# Patient Record
Sex: Female | Born: 1995
Health system: Southern US, Community
[De-identification: ages and names within clinical notes are randomized; demographics above are authoritative.]

## PROBLEM LIST (undated history)

## (undated) DIAGNOSIS — L309 Dermatitis, unspecified: Secondary | ICD-10-CM

## (undated) DIAGNOSIS — L509 Urticaria, unspecified: Secondary | ICD-10-CM

## (undated) DIAGNOSIS — J45909 Unspecified asthma, uncomplicated: Secondary | ICD-10-CM

## (undated) HISTORY — DX: Urticaria, unspecified: L50.9

## (undated) HISTORY — DX: Dermatitis, unspecified: L30.9

## (undated) HISTORY — PX: TYMPANOSTOMY TUBE PLACEMENT: SHX32

## (undated) HISTORY — DX: Unspecified asthma, uncomplicated: J45.909

---
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
## 2017-01-09 DIAGNOSIS — Z3042 Encounter for surveillance of injectable contraceptive: Secondary | ICD-10-CM | POA: Diagnosis not present

## 2017-02-03 DIAGNOSIS — Z202 Contact with and (suspected) exposure to infections with a predominantly sexual mode of transmission: Secondary | ICD-10-CM | POA: Diagnosis not present

## 2017-02-03 DIAGNOSIS — L259 Unspecified contact dermatitis, unspecified cause: Secondary | ICD-10-CM | POA: Diagnosis not present

## 2017-02-15 DIAGNOSIS — B373 Candidiasis of vulva and vagina: Secondary | ICD-10-CM | POA: Diagnosis not present

## 2017-02-15 DIAGNOSIS — N898 Other specified noninflammatory disorders of vagina: Secondary | ICD-10-CM | POA: Diagnosis not present

## 2017-03-02 DIAGNOSIS — N760 Acute vaginitis: Secondary | ICD-10-CM | POA: Diagnosis not present

## 2017-03-02 DIAGNOSIS — N899 Noninflammatory disorder of vagina, unspecified: Secondary | ICD-10-CM | POA: Diagnosis not present

## 2017-03-16 DIAGNOSIS — S9002XA Contusion of left ankle, initial encounter: Secondary | ICD-10-CM | POA: Diagnosis not present

## 2017-03-16 DIAGNOSIS — S8002XA Contusion of left knee, initial encounter: Secondary | ICD-10-CM | POA: Diagnosis not present

## 2017-03-30 DIAGNOSIS — Z113 Encounter for screening for infections with a predominantly sexual mode of transmission: Secondary | ICD-10-CM | POA: Diagnosis not present

## 2017-04-03 DIAGNOSIS — Z3042 Encounter for surveillance of injectable contraceptive: Secondary | ICD-10-CM | POA: Diagnosis not present

## 2017-04-18 DIAGNOSIS — K080 Exfoliation of teeth due to systemic causes: Secondary | ICD-10-CM | POA: Diagnosis not present

## 2017-05-02 DIAGNOSIS — K080 Exfoliation of teeth due to systemic causes: Secondary | ICD-10-CM | POA: Diagnosis not present

## 2017-05-10 DIAGNOSIS — H6983 Other specified disorders of Eustachian tube, bilateral: Secondary | ICD-10-CM | POA: Diagnosis not present

## 2017-05-10 DIAGNOSIS — J069 Acute upper respiratory infection, unspecified: Secondary | ICD-10-CM | POA: Diagnosis not present

## 2017-05-10 DIAGNOSIS — R509 Fever, unspecified: Secondary | ICD-10-CM | POA: Diagnosis not present

## 2017-05-10 DIAGNOSIS — H65192 Other acute nonsuppurative otitis media, left ear: Secondary | ICD-10-CM | POA: Diagnosis not present

## 2017-05-30 DIAGNOSIS — L700 Acne vulgaris: Secondary | ICD-10-CM | POA: Diagnosis not present

## 2017-05-31 DIAGNOSIS — Z23 Encounter for immunization: Secondary | ICD-10-CM | POA: Diagnosis not present

## 2017-06-12 DIAGNOSIS — N760 Acute vaginitis: Secondary | ICD-10-CM | POA: Diagnosis not present

## 2017-06-19 DIAGNOSIS — Z3042 Encounter for surveillance of injectable contraceptive: Secondary | ICD-10-CM | POA: Diagnosis not present

## 2017-07-14 DIAGNOSIS — N899 Noninflammatory disorder of vagina, unspecified: Secondary | ICD-10-CM | POA: Diagnosis not present

## 2017-07-14 DIAGNOSIS — N938 Other specified abnormal uterine and vaginal bleeding: Secondary | ICD-10-CM | POA: Diagnosis not present

## 2017-07-14 DIAGNOSIS — R109 Unspecified abdominal pain: Secondary | ICD-10-CM | POA: Diagnosis not present

## 2017-07-26 DIAGNOSIS — K080 Exfoliation of teeth due to systemic causes: Secondary | ICD-10-CM | POA: Diagnosis not present

## 2017-08-08 DIAGNOSIS — J029 Acute pharyngitis, unspecified: Secondary | ICD-10-CM | POA: Diagnosis not present

## 2017-08-24 DIAGNOSIS — Z01419 Encounter for gynecological examination (general) (routine) without abnormal findings: Secondary | ICD-10-CM | POA: Diagnosis not present

## 2017-09-05 DIAGNOSIS — Z30017 Encounter for initial prescription of implantable subdermal contraceptive: Secondary | ICD-10-CM | POA: Diagnosis not present

## 2017-09-05 DIAGNOSIS — Z3202 Encounter for pregnancy test, result negative: Secondary | ICD-10-CM | POA: Diagnosis not present

## 2017-09-11 ENCOUNTER — Encounter (INDEPENDENT_AMBULATORY_CARE_PROVIDER_SITE_OTHER): Payer: Self-pay | Admitting: Pediatric Gastroenterology

## 2017-10-26 DIAGNOSIS — Z309 Encounter for contraceptive management, unspecified: Secondary | ICD-10-CM | POA: Diagnosis not present

## 2017-10-26 DIAGNOSIS — N760 Acute vaginitis: Secondary | ICD-10-CM | POA: Diagnosis not present

## 2017-10-26 DIAGNOSIS — Z01419 Encounter for gynecological examination (general) (routine) without abnormal findings: Secondary | ICD-10-CM | POA: Diagnosis not present

## 2017-11-05 IMAGING — CR DG ABDOMEN 1V
1 series · 1 of 1 positions shown · non-contrast
Comparison: None.

CLINICAL DATA: Rectal, constipation

EXAM:
ABDOMEN - 1 VIEW

[t abdomen supine]
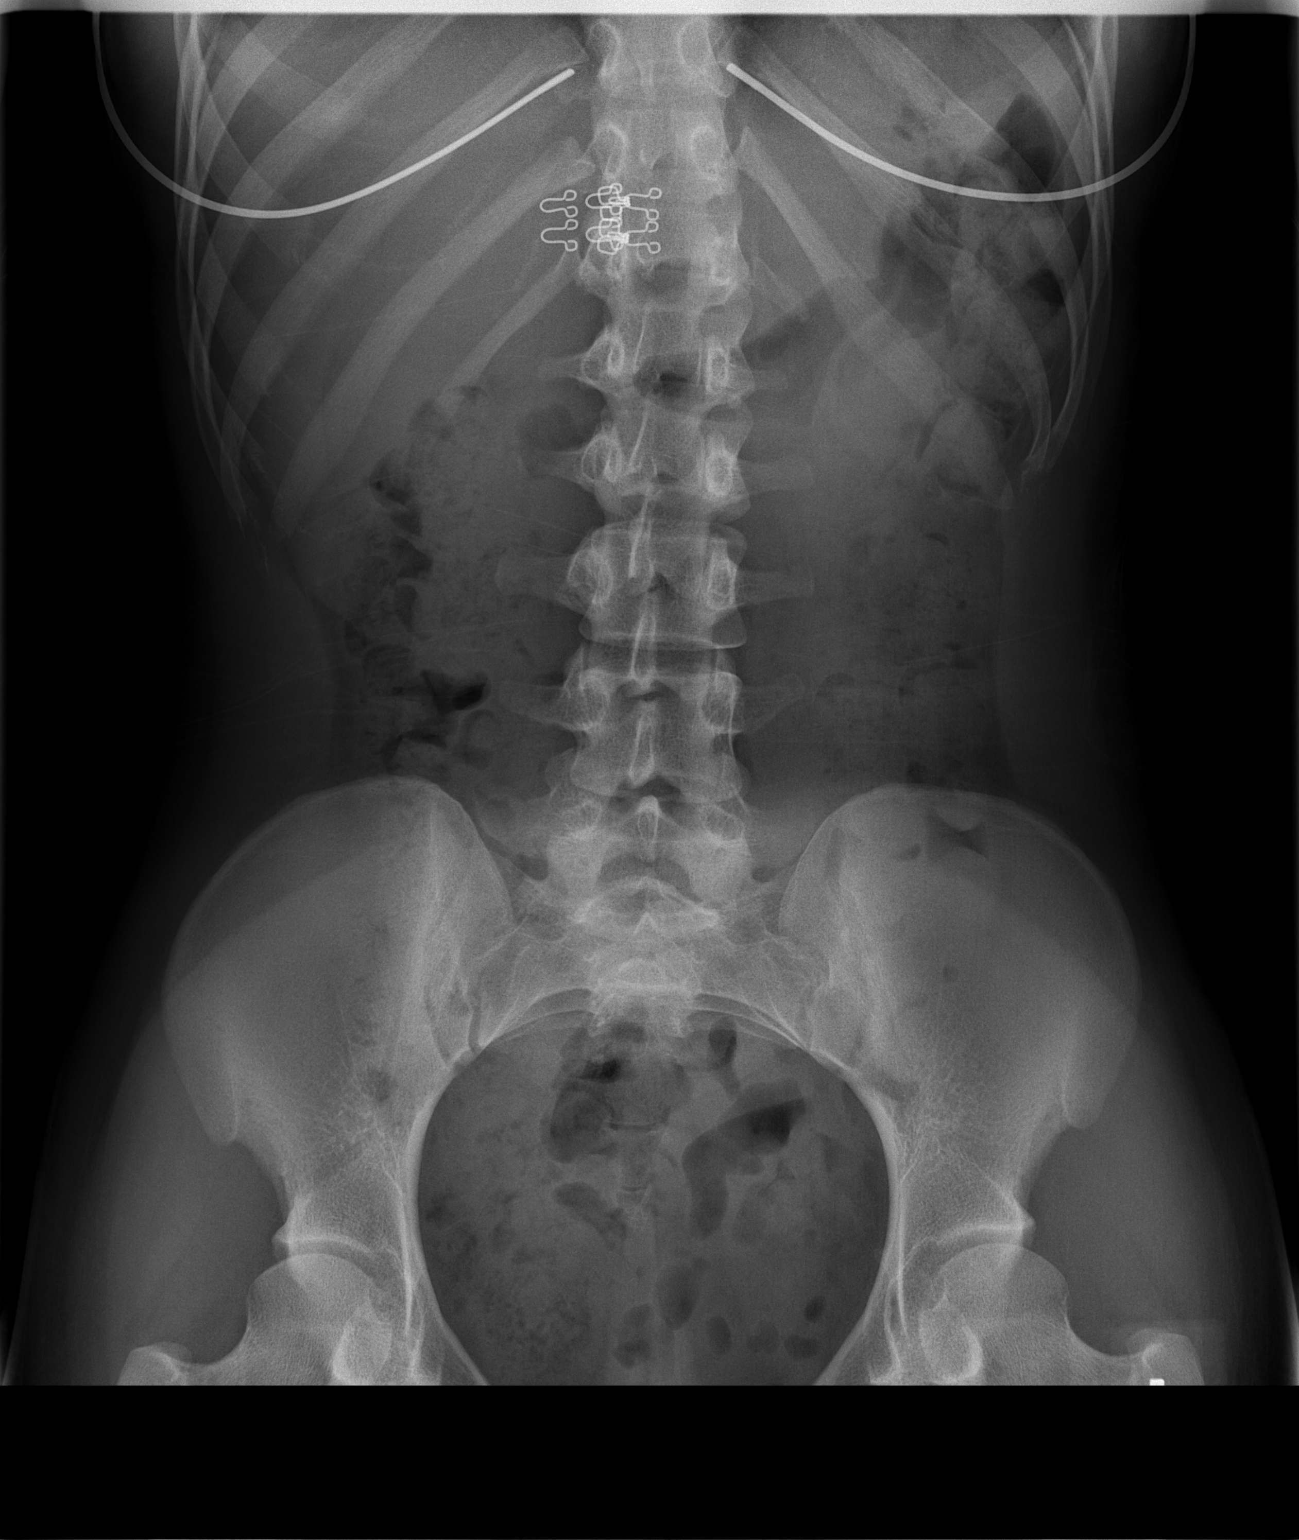

[1 of 1 positions shown; findings below may reference images not displayed]

FINDINGS: Moderate amount of stool throughout the colon. There is no bowel
dilatation to suggest obstruction. There is no evidence of
pneumoperitoneum, portal venous gas or pneumatosis. There are no
pathologic calcifications along the expected course of the ureters.
The osseous structures are unremarkable.
IMPRESSION: Moderate amount of stool throughout the colon.

## 2017-11-06 DIAGNOSIS — K080 Exfoliation of teeth due to systemic causes: Secondary | ICD-10-CM | POA: Diagnosis not present

## 2017-11-23 DIAGNOSIS — Z113 Encounter for screening for infections with a predominantly sexual mode of transmission: Secondary | ICD-10-CM | POA: Diagnosis not present

## 2017-11-29 DIAGNOSIS — R51 Headache: Secondary | ICD-10-CM | POA: Diagnosis not present

## 2017-12-14 DIAGNOSIS — Z30017 Encounter for initial prescription of implantable subdermal contraceptive: Secondary | ICD-10-CM | POA: Diagnosis not present

## 2017-12-14 DIAGNOSIS — Z3202 Encounter for pregnancy test, result negative: Secondary | ICD-10-CM | POA: Diagnosis not present

## 2018-01-15 DIAGNOSIS — Z113 Encounter for screening for infections with a predominantly sexual mode of transmission: Secondary | ICD-10-CM | POA: Diagnosis not present

## 2018-02-01 DIAGNOSIS — Z113 Encounter for screening for infections with a predominantly sexual mode of transmission: Secondary | ICD-10-CM | POA: Diagnosis not present

## 2018-02-01 DIAGNOSIS — N898 Other specified noninflammatory disorders of vagina: Secondary | ICD-10-CM | POA: Diagnosis not present

## 2018-03-02 DIAGNOSIS — N760 Acute vaginitis: Secondary | ICD-10-CM | POA: Diagnosis not present

## 2018-03-02 DIAGNOSIS — Z113 Encounter for screening for infections with a predominantly sexual mode of transmission: Secondary | ICD-10-CM | POA: Diagnosis not present

## 2018-03-02 DIAGNOSIS — R350 Frequency of micturition: Secondary | ICD-10-CM | POA: Diagnosis not present

## 2018-04-03 DIAGNOSIS — N926 Irregular menstruation, unspecified: Secondary | ICD-10-CM | POA: Diagnosis not present

## 2018-04-10 DIAGNOSIS — Z978 Presence of other specified devices: Secondary | ICD-10-CM | POA: Diagnosis not present

## 2018-04-10 DIAGNOSIS — N921 Excessive and frequent menstruation with irregular cycle: Secondary | ICD-10-CM | POA: Diagnosis not present

## 2018-04-17 DIAGNOSIS — A749 Chlamydial infection, unspecified: Secondary | ICD-10-CM | POA: Diagnosis not present

## 2018-04-17 DIAGNOSIS — Z113 Encounter for screening for infections with a predominantly sexual mode of transmission: Secondary | ICD-10-CM | POA: Diagnosis not present

## 2018-04-18 DIAGNOSIS — Z113 Encounter for screening for infections with a predominantly sexual mode of transmission: Secondary | ICD-10-CM | POA: Diagnosis not present

## 2018-04-18 DIAGNOSIS — A749 Chlamydial infection, unspecified: Secondary | ICD-10-CM | POA: Diagnosis not present

## 2018-05-09 DIAGNOSIS — Z3042 Encounter for surveillance of injectable contraceptive: Secondary | ICD-10-CM | POA: Diagnosis not present

## 2018-05-09 DIAGNOSIS — Z3046 Encounter for surveillance of implantable subdermal contraceptive: Secondary | ICD-10-CM | POA: Diagnosis not present

## 2018-05-14 DIAGNOSIS — K080 Exfoliation of teeth due to systemic causes: Secondary | ICD-10-CM | POA: Diagnosis not present

## 2018-05-25 DIAGNOSIS — N926 Irregular menstruation, unspecified: Secondary | ICD-10-CM | POA: Diagnosis not present

## 2018-07-31 DIAGNOSIS — Z3042 Encounter for surveillance of injectable contraceptive: Secondary | ICD-10-CM | POA: Diagnosis not present

## 2018-08-08 DIAGNOSIS — L700 Acne vulgaris: Secondary | ICD-10-CM | POA: Diagnosis not present

## 2018-08-20 ENCOUNTER — Encounter (HOSPITAL_COMMUNITY): Payer: Self-pay | Admitting: Emergency Medicine

## 2018-08-20 ENCOUNTER — Other Ambulatory Visit: Payer: Self-pay

## 2018-08-20 ENCOUNTER — Ambulatory Visit (HOSPITAL_COMMUNITY)
Admission: EM | Admit: 2018-08-20 | Discharge: 2018-08-20 | Disposition: A | Discharge status: Home / Self Care | Payer: Federal, State, Local not specified - PPO | Attending: Family Medicine | Admitting: Family Medicine

## 2018-08-20 DIAGNOSIS — R42 Dizziness and giddiness: Secondary | ICD-10-CM

## 2018-08-20 DIAGNOSIS — R55 Syncope and collapse: Secondary | ICD-10-CM | POA: Diagnosis not present

## 2018-08-20 DIAGNOSIS — Z3202 Encounter for pregnancy test, result negative: Secondary | ICD-10-CM

## 2018-08-20 DIAGNOSIS — R110 Nausea: Secondary | ICD-10-CM | POA: Diagnosis not present

## 2018-08-20 LAB — POCT URINALYSIS DIP (DEVICE)
Bilirubin Urine: NEGATIVE
Glucose, UA: NEGATIVE mg/dL
Ketones, ur: NEGATIVE mg/dL
Leukocytes, UA: NEGATIVE
Nitrite: NEGATIVE
Protein, ur: 30 mg/dL — AB
Urobilinogen, UA: 0.2 mg/dL (ref 0.0–1.0)
pH: 5.5 (ref 5.0–8.0)

## 2018-08-20 LAB — GLUCOSE, CAPILLARY: Glucose-Capillary: 93 mg/dL (ref 70–99)

## 2018-08-20 LAB — POCT PREGNANCY, URINE: Preg Test, Ur: NEGATIVE

## 2018-08-20 NOTE — Discharge Instructions (Addendum)
Drink plenty of fluids today and tomorrow.  Your laboratory tests are normal.

## 2018-08-20 NOTE — ED Triage Notes (Signed)
Pt was at a club last night and was smoking marijuana from someone she barely knew.  Pt states she got dizzy, and passed out.  When she woke up she was nauseous, sweating and still dizzy.  Today, pt is still feeling dizzy and having bouts of nausea.

## 2018-08-20 NOTE — ED Provider Notes (Signed)
[facility]    CSN: [id number] Arrival date & time: 08/20/18  1151     History   Chief Complaint Chief Complaint  Patient presents with  . Near Syncope    HPI Glenda Adams is a 23 y.o. female.   Pt was at a club last night and was smoking marijuana from someone she barely knew.  Pt states she got dizzy, and passed out.  When she woke up she was nauseous, sweating and still dizzy.  Today, pt is still feeling dizzy and having bouts of nausea.  She denies drinking anything at the bar last night.  Patient is an established patient.  She goes to [employer] [facility] [employer].     History reviewed. No pertinent past medical history.  Patient Active Problem List   Diagnosis Date Noted  . Anal fissure 06/01/2016  . Rectal bleeding 06/01/2016    History reviewed. No pertinent surgical history.  OB History    Gravida  1   Para      Term      Preterm      AB      Living        SAB      TAB      Ectopic      Multiple      Live Births               Home Medications    Prior to Admission medications   Medication Sig Start Date End Date Taking? Authorizing Provider  etonogestrel (NEXPLANON) 68 MG IMPL implant 1 each by Subdermal route once.   Yes [provider]    Family History History reviewed. No pertinent family history.  Social History Social History   Tobacco Use  . Smoking status: Never Smoker  . Smokeless tobacco: Never Used  Substance Use Topics  . Alcohol use: Yes  . Drug use: Yes    Types: Marijuana     Allergies   Patient has no known allergies.   Review of Systems Review of Systems   Physical Exam Triage Vital Signs ED Triage Vitals [08/20/18 1224]  Enc Vitals Group     BP 112/62     Pulse Rate 88     Resp      Temp 98.2 F (36.8 C)     Temp Source Temporal     SpO2 100 %     Weight      Height      Head Circumference      Peak Flow      Pain Score      Pain Loc        Pain Edu?      Excl. in GC?    No data found.  Updated Vital Signs BP 112/62 (BP Location: Left Arm)   Pulse 88   Temp 98.2 F (36.8 C) (Temporal)   LMP 08/13/2018 (Exact Date)   SpO2 100%    Physical Exam Vitals signs and nursing note reviewed.  Constitutional:      Appearance: Normal appearance. She is normal weight.  HENT:     Head: Normocephalic and atraumatic.     Right Ear: Tympanic membrane, ear canal and external ear normal.     Left Ear: Tympanic membrane, ear canal and external ear normal.     Nose: Nose normal.     Mouth/Throat:     Mouth: Mucous membranes are moist.     Pharynx: Oropharynx is clear.  Eyes:     Conjunctiva/sclera: Conjunctivae normal.     Pupils: Pupils are equal, round, and reactive to light.  Neck:     Musculoskeletal: Normal range of motion and neck supple.  Cardiovascular:     Rate and Rhythm: Normal rate and regular rhythm.     Heart sounds: Normal heart sounds.  Pulmonary:     Effort: Pulmonary effort is normal.     Breath sounds: Normal breath sounds.  Abdominal:     General: Abdomen is flat. Bowel sounds are normal.     Palpations: There is no mass.     Tenderness: There is no abdominal tenderness.  Musculoskeletal: Normal range of motion.  Skin:    General: Skin is warm and dry.  Neurological:     General: No focal deficit present.     Mental Status: She is alert and oriented to person, place, and time.  Psychiatric:        Mood and Affect: Mood normal.        Behavior: Behavior normal.        Thought Content: Thought content normal.      UC Treatments / Results  Labs (all labs ordered are listed, but only abnormal results are displayed) Labs Reviewed  POCT URINALYSIS DIP (DEVICE) - Abnormal; Notable for the following components:      Result Value   Hgb urine dipstick LARGE (*)    Protein, ur 30 (*)    All other components within normal limits  GLUCOSE, CAPILLARY  CBG MONITORING, ED  POC URINE PREG, ED  POCT  PREGNANCY, URINE    EKG None  Radiology No results found.  Procedures Procedures (including critical care time)  Medications Ordered in UC Medications - No data to display  Initial Impression / Assessment and Plan / UC Course  I have reviewed the triage vital signs and the nursing notes.  Pertinent labs & imaging results that were available during my care of the patient were reviewed by me and considered in my medical decision making (see chart for details).  Clinical Course as of Aug 20 1345  Mon Aug 20, 2018  1316 POC CBG monitoring [KL]  1316 POC urine pregnancy [KL]    Clinical Course User Index [KL] Elvina Sidle, MD   Final Clinical Impressions(s) / UC Diagnoses   Final diagnoses:  Near syncope     Discharge Instructions     Drink plenty of fluids today and tomorrow.  Your laboratory tests are normal.    ED Prescriptions    None     Controlled Substance Prescriptions [place] Controlled Substance Registry consulted? Not Applicable   Elvina Sidle, MD 08/20/18 1347

## 2018-08-21 DIAGNOSIS — Z113 Encounter for screening for infections with a predominantly sexual mode of transmission: Secondary | ICD-10-CM | POA: Diagnosis not present

## 2018-08-21 DIAGNOSIS — Z3046 Encounter for surveillance of implantable subdermal contraceptive: Secondary | ICD-10-CM | POA: Diagnosis not present

## 2018-08-21 DIAGNOSIS — Z30015 Encounter for initial prescription of vaginal ring hormonal contraceptive: Secondary | ICD-10-CM | POA: Diagnosis not present

## 2018-08-21 DIAGNOSIS — R55 Syncope and collapse: Secondary | ICD-10-CM | POA: Diagnosis not present

## 2018-09-13 DIAGNOSIS — B373 Candidiasis of vulva and vagina: Secondary | ICD-10-CM | POA: Diagnosis not present

## 2018-09-13 DIAGNOSIS — N760 Acute vaginitis: Secondary | ICD-10-CM | POA: Diagnosis not present

## 2018-09-13 DIAGNOSIS — Z113 Encounter for screening for infections with a predominantly sexual mode of transmission: Secondary | ICD-10-CM | POA: Diagnosis not present

## 2018-10-04 DIAGNOSIS — K080 Exfoliation of teeth due to systemic causes: Secondary | ICD-10-CM | POA: Diagnosis not present

## 2018-10-16 DIAGNOSIS — N898 Other specified noninflammatory disorders of vagina: Secondary | ICD-10-CM | POA: Diagnosis not present

## 2018-10-18 DIAGNOSIS — Z3042 Encounter for surveillance of injectable contraceptive: Secondary | ICD-10-CM | POA: Diagnosis not present

## 2018-11-22 DIAGNOSIS — N760 Acute vaginitis: Secondary | ICD-10-CM | POA: Diagnosis not present

## 2018-11-22 DIAGNOSIS — Z113 Encounter for screening for infections with a predominantly sexual mode of transmission: Secondary | ICD-10-CM | POA: Diagnosis not present

## 2018-11-22 DIAGNOSIS — B373 Candidiasis of vulva and vagina: Secondary | ICD-10-CM | POA: Diagnosis not present

## 2019-01-03 DIAGNOSIS — Z6820 Body mass index (BMI) 20.0-20.9, adult: Secondary | ICD-10-CM | POA: Diagnosis not present

## 2019-01-03 DIAGNOSIS — Z3042 Encounter for surveillance of injectable contraceptive: Secondary | ICD-10-CM | POA: Diagnosis not present

## 2019-01-03 DIAGNOSIS — Z01419 Encounter for gynecological examination (general) (routine) without abnormal findings: Secondary | ICD-10-CM | POA: Diagnosis not present

## 2019-01-03 DIAGNOSIS — Z113 Encounter for screening for infections with a predominantly sexual mode of transmission: Secondary | ICD-10-CM | POA: Diagnosis not present

## 2019-01-07 DIAGNOSIS — Z20828 Contact with and (suspected) exposure to other viral communicable diseases: Secondary | ICD-10-CM | POA: Diagnosis not present

## 2019-01-22 DIAGNOSIS — J0110 Acute frontal sinusitis, unspecified: Secondary | ICD-10-CM | POA: Diagnosis not present

## 2019-01-22 DIAGNOSIS — Z77120 Contact with and (suspected) exposure to mold (toxic): Secondary | ICD-10-CM | POA: Diagnosis not present

## 2019-01-22 DIAGNOSIS — T7840XA Allergy, unspecified, initial encounter: Secondary | ICD-10-CM | POA: Diagnosis not present

## 2019-01-22 DIAGNOSIS — J4520 Mild intermittent asthma, uncomplicated: Secondary | ICD-10-CM | POA: Diagnosis not present

## 2019-02-13 ENCOUNTER — Encounter: Payer: Self-pay | Admitting: Allergy

## 2019-02-13 ENCOUNTER — Ambulatory Visit (INDEPENDENT_AMBULATORY_CARE_PROVIDER_SITE_OTHER): Payer: Federal, State, Local not specified - PPO | Admitting: Allergy

## 2019-02-13 ENCOUNTER — Other Ambulatory Visit: Payer: Self-pay

## 2019-02-13 VITALS — BP 114/60 | HR 67 | Temp 98.2°F | Resp 18 | Ht 60.0 in | Wt 104.4 lb

## 2019-02-13 DIAGNOSIS — Z77120 Contact with and (suspected) exposure to mold (toxic): Secondary | ICD-10-CM | POA: Diagnosis not present

## 2019-02-13 DIAGNOSIS — T781XXD Other adverse food reactions, not elsewhere classified, subsequent encounter: Secondary | ICD-10-CM | POA: Diagnosis not present

## 2019-02-13 DIAGNOSIS — J4520 Mild intermittent asthma, uncomplicated: Secondary | ICD-10-CM

## 2019-02-13 NOTE — Patient Instructions (Addendum)
Return for skin testing - no antihistamines for 3 days before.  Today's breathing test was normal.   Rhinitis:  May use Flonase 1 spray in each nostril 1-2 times a day for nasal congestion.  Asthma: . Daily controller medication(s): None . Prior to physical activity: May use albuterol rescue inhaler 2 puffs 5 to 15 minutes prior to strenuous physical activities. Marland Kitchen Rescue medications: May use albuterol rescue inhaler 2 puffs or nebulizer every 4 to 6 hours as needed for shortness of breath, chest tightness, coughing, and wheezing. Monitor frequency of use.  . Asthma control goals:  o Full participation in all desired activities (may need albuterol before activity) o Albuterol use two times or less a week on average (not counting use with activity) o Cough interfering with sleep two times or less a month o Oral steroids no more than once a year o No hospitalizations  Follow up for skin testing.

## 2019-02-13 NOTE — Progress Notes (Signed)
New Patient Note  RE: Glenda Adams MRN: [id number] DOB: Mar 30, 1996 Date of Office Visit: 02/13/2019  Referring provider: No ref. provider found Primary care provider: Patient, No Pcp Per  Chief Complaint: Sore Throat, Nasal Congestion, and Burning Eyes  History of Present Illness: I had the pleasure of seeing Glenda Adams for initial evaluation at the [facility] on 02/14/2019. She is a 23 y.o. female, who is referred here by [facility] for the evaluation of mold allergy.   She reports symptoms of sore throat, nasal congestion, burning eyes, chest tightness, sneezing, rhinorrhea. Symptoms have been going on for 2 weeks. Anosmia: yes initially but now back to normal. Headache: yes. She has used antibiotics for the sinus infection, Flonase and zyrtec with some improvement in symptoms. Sinus infections: yes. Previous work up includes: none. Previous ENT evaluation: no. Patient was living in the apartment for about 1 year and noticed the mold issue for about 2 months. The mold has been cleaned out. She is moving out next week to a different apartment. Concerned about mold allergies.   Assessment and Plan: Glenda Adams is a 23 y.o. female with: Mold exposure Developed rhinitis and chest tightness for the past 2 weeks. Patient concerned about mold exposure as there was mold in her apartment for the past 2 months. Tried Flonase, antibiotics and zyrtec with some benefit.   Unable to skin test today due to recent antihistamine intake.  Return for skin testing - no antihistamines for 3 days before. Will make additional recommendations based on results.   May use Flonase 1 spray in each nostril 1-2 times a day for nasal congestion.  Mild intermittent asthma without complication Diagnosed with asthma over 10 years ago. Currently using albuterol less than once a week with good benefit. Main triggers are URIs.  Today's spirometry was normal. . Daily controller medication(s):  None . Prior to physical activity: May use albuterol rescue inhaler 2 puffs 5 to 15 minutes prior to strenuous physical activities. Marland Kitchen Rescue medications: May use albuterol rescue inhaler 2 puffs or nebulizer every 4 to 6 hours as needed for shortness of breath, chest tightness, coughing, and wheezing. Monitor frequency of use.   Adverse food reaction Perioral pruritus after pineapple ingestion.  Continue to avoid pineapple. Will skin test at next visit.  Unable to skin test today due to recent antihistamine intake.  For mild symptoms you can take over the counter antihistamines such as Benadryl and monitor symptoms closely. If symptoms worsen or if you have severe symptoms including breathing issues, throat closure, significant swelling, whole body hives, severe diarrhea and vomiting, lightheadedness then seek immediate medical care.   Return for Skin testing.  Other allergy screening: Asthma: yes  She reports symptoms of chest tightness, shortness of breath, wheezing for 10+ years. Current medications include albuterol prn which help. She reports not using aerochamber with asthma inhalers. She tried the following inhalers: albuterol nebulizer. Main asthma triggers are infections. In the last month, frequency of asthma symptoms: <1x/week. Frequency of nocturnal symptoms: 0x/month. Frequency of SABA use: <1x/week. Interference with physical activity: no. Sleep is undisturbed. In the last 12 months, emergency room visits/urgent care visits/doctor office visits or hospitalizations due to asthma: 0. In the last 12 months, oral steroids courses: 0. Lifetime history of hospitalization for asthma: 0. Prior intubations: 0. Asthma was diagnosed at age 74. History of pneumonia: as a child. She was not evaluated by allergist/pulmonologist in the past. Smoking exposure: marijuana. Up to date  with flu vaccine: yes. History of reflux: yes. Food allergy: yes  Pineapple - tongue pruritus Medication allergy: no  Hymenoptera allergy: no Urticaria: no Eczema:yes History of recurrent infections suggestive of immunodeficency: no  Diagnostics: Spirometry:  Tracings reviewed. Her effort: Good reproducible efforts. FVC: 3.34L FEV1: 2.85L, 112% predicted FEV1/FVC ratio: 85% Interpretation: Spirometry consistent with normal pattern.  Please see scanned spirometry results for details.  Skin Testing: Deferred due to recent antihistamines use.  Past Medical History: Patient Active Problem List   Diagnosis Date Noted  . Mild intermittent asthma without complication 02/14/2019  . Mold exposure 02/14/2019  . Adverse food reaction 02/14/2019  . Anal fissure 06/01/2016  . Rectal bleeding 06/01/2016   Past Medical History:  Diagnosis Date  . Asthma   . Eczema   . Urticaria    Past Surgical History: Past Surgical History:  Procedure Laterality Date  . ARTHROSCOPIC REPAIR ACL Left 2017  . TYMPANOSTOMY TUBE PLACEMENT     Medication List:  Current Outpatient Medications  Medication Sig Dispense Refill  . albuterol (VENTOLIN HFA) 108 (90 Base) MCG/ACT inhaler Inhale 2 puffs into the lungs every 6 (six) hours as needed for wheezing or shortness of breath.    . cetirizine (ZYRTEC) 10 MG tablet Take 10 mg by mouth daily.    Marland Kitchen. etonogestrel-ethinyl estradiol (NUVARING) 0.12-0.015 MG/24HR vaginal ring Place 1 each vaginally every 28 (twenty-eight) days. Insert vaginally and leave in place for 3 consecutive weeks, then remove for 1 week.    . phenylephrine (SUDAFED PE) 10 MG TABS tablet Take 10 mg by mouth every 4 (four) hours as needed.     No current facility-administered medications for this visit.    Allergies: No Known Allergies Social History: Social History   Socioeconomic History  . Marital status: Single    Spouse name: Not on file  . Number of children: Not on file  . Years of education: Not on file  . Highest education level: Not on file  Occupational History  . Not on file  Social  Needs  . Financial resource strain: Not on file  . Food insecurity    Worry: Not on file    Inability: Not on file  . Transportation needs    Medical: Not on file    Non-medical: Not on file  Tobacco Use  . Smoking status: Never Smoker  . Smokeless tobacco: Never Used  Substance and Sexual Activity  . Alcohol use: Yes  . Drug use: Yes    Types: Marijuana  . Sexual activity: Not on file  Lifestyle  . Physical activity    Days per week: Not on file    Minutes per session: Not on file  . Stress: Not on file  Relationships  . Social Musicianconnections    Talks on phone: Not on file    Gets together: Not on file    Attends religious service: Not on file    Active member of club or organization: Not on file    Attends meetings of clubs or organizations: Not on file    Relationship status: Not on file  Other Topics Concern  . Not on file  Social History Narrative  . Not on file   Lives in an apartment. Smoking: denies Occupation: Press photographerstudent  Environmental HistorySurveyor, minerals: Water Damage/mildew in the house: yes Carpet in the family room: no Carpet in the bedroom: no Heating: electric Cooling: central Pet: no  Family History: Family History  Problem Relation Age of Onset  . Allergic  rhinitis Mother   . Heart murmur Mother   . Asthma Sister    Review of Systems  Constitutional: Negative for appetite change, chills, fever and unexpected weight change.  HENT: Positive for congestion, postnasal drip and sore throat. Negative for rhinorrhea.   Eyes: Negative for itching.  Respiratory: Positive for chest tightness. Negative for cough, shortness of breath and wheezing.   Cardiovascular: Negative for chest pain.  Gastrointestinal: Negative for abdominal pain.  Genitourinary: Negative for difficulty urinating.  Skin: Negative for rash.  Neurological: Negative for headaches.   Objective: BP 114/60   Pulse 67   Temp 98.2 F (36.8 C) (Temporal)   Resp 18   Ht 5' (1.524 m)   Wt 104 lb  6.4 oz (47.4 kg)   SpO2 98%   BMI 20.39 kg/m  Body mass index is 20.39 kg/m. Physical Exam  Constitutional: She is oriented to person, place, and time. She appears well-developed and well-nourished.  HENT:  Head: Normocephalic and atraumatic.  Right Ear: External ear normal.  Left Ear: External ear normal.  Nose: Nose normal.  Mouth/Throat: Oropharynx is clear and moist.  Eyes: Conjunctivae and EOM are normal.  Neck: Neck supple.  Cardiovascular: Normal rate, regular rhythm and normal heart sounds. Exam reveals no gallop and no friction rub.  No murmur heard. Pulmonary/Chest: Effort normal and breath sounds normal. She has no wheezes. She has no rales.  Abdominal: Soft.  Neurological: She is alert and oriented to person, place, and time.  Skin: Skin is warm. No rash noted.  Psychiatric: She has a normal mood and affect. Her behavior is normal.  Nursing note and vitals reviewed.  The plan was reviewed with the patient/family, and all questions/concerned were addressed.  It was my pleasure to see Glenda Adams today and participate in her care. Please feel free to contact me with any questions or concerns.  Sincerely,  Wyline MoodYoon , DO Allergy & Immunology  [facility] [place] office: [phone] [facility] office: [phone] [place] office: [phone]

## 2019-02-14 DIAGNOSIS — J4520 Mild intermittent asthma, uncomplicated: Secondary | ICD-10-CM | POA: Insufficient documentation

## 2019-02-14 DIAGNOSIS — Z77120 Contact with and (suspected) exposure to mold (toxic): Secondary | ICD-10-CM | POA: Insufficient documentation

## 2019-02-14 DIAGNOSIS — R399 Unspecified symptoms and signs involving the genitourinary system: Secondary | ICD-10-CM | POA: Diagnosis not present

## 2019-02-14 DIAGNOSIS — T781XXA Other adverse food reactions, not elsewhere classified, initial encounter: Secondary | ICD-10-CM | POA: Insufficient documentation

## 2019-02-14 DIAGNOSIS — Z113 Encounter for screening for infections with a predominantly sexual mode of transmission: Secondary | ICD-10-CM | POA: Diagnosis not present

## 2019-02-14 HISTORY — DX: Mild intermittent asthma, uncomplicated: J45.20

## 2019-02-14 NOTE — Assessment & Plan Note (Signed)
Perioral pruritus after pineapple ingestion.  Continue to avoid pineapple. Will skin test at next visit.  Unable to skin test today due to recent antihistamine intake.  For mild symptoms you can take over the counter antihistamines such as Benadryl and monitor symptoms closely. If symptoms worsen or if you have severe symptoms including breathing issues, throat closure, significant swelling, whole body hives, severe diarrhea and vomiting, lightheadedness then seek immediate medical care.

## 2019-02-14 NOTE — Assessment & Plan Note (Signed)
Diagnosed with asthma over 10 years ago. Currently using albuterol less than once a week with good benefit. Main triggers are URIs.  Today's spirometry was normal. . Daily controller medication(s): None . Prior to physical activity: May use albuterol rescue inhaler 2 puffs 5 to 15 minutes prior to strenuous physical activities. Marland Kitchen Rescue medications: May use albuterol rescue inhaler 2 puffs or nebulizer every 4 to 6 hours as needed for shortness of breath, chest tightness, coughing, and wheezing. Monitor frequency of use.

## 2019-02-14 NOTE — Assessment & Plan Note (Signed)
Developed rhinitis and chest tightness for the past 2 weeks. Patient concerned about mold exposure as there was mold in her apartment for the past 2 months. Tried Flonase, antibiotics and zyrtec with some benefit.   Unable to skin test today due to recent antihistamine intake.  Return for skin testing - no antihistamines for 3 days before. Will make additional recommendations based on results.   May use Flonase 1 spray in each nostril 1-2 times a day for nasal congestion.

## 2019-03-06 ENCOUNTER — Other Ambulatory Visit: Payer: Self-pay

## 2019-03-06 ENCOUNTER — Encounter: Payer: Self-pay | Admitting: Allergy

## 2019-03-06 ENCOUNTER — Ambulatory Visit (INDEPENDENT_AMBULATORY_CARE_PROVIDER_SITE_OTHER): Payer: Federal, State, Local not specified - PPO | Admitting: Allergy

## 2019-03-06 VITALS — BP 116/76 | HR 80 | Temp 98.1°F | Resp 18

## 2019-03-06 DIAGNOSIS — T781XXD Other adverse food reactions, not elsewhere classified, subsequent encounter: Secondary | ICD-10-CM | POA: Diagnosis not present

## 2019-03-06 DIAGNOSIS — J4520 Mild intermittent asthma, uncomplicated: Secondary | ICD-10-CM

## 2019-03-06 DIAGNOSIS — J3089 Other allergic rhinitis: Secondary | ICD-10-CM | POA: Diagnosis not present

## 2019-03-06 MED ORDER — MONTELUKAST SODIUM 10 MG PO TABS: 10.0000 mg | ORAL_TABLET | Freq: Every day | ORAL | 5 refills | Status: DC

## 2019-03-06 NOTE — Progress Notes (Signed)
Follow Up Note  RE: Glenda Adams MRN: [id number] DOB: 04-27-1996 Date of Office Visit: 03/06/2019  Referring provider: No ref. provider found Primary care provider: Patient, No Pcp Per  Chief Complaint: Allergy Testing  History of Present Illness: I had the pleasure of seeing Glenda Adams for a follow up visit at the [facility] on 03/06/2019. She is a 23 y.o. female, who is being followed for mold exposure, asthma, adverse food reaction. Today she is here for skin testing. Her previous allergy office visit was on 02/13/2019 with Dr. Selena BattenKim.  Rhinitis  Worsening rhinitis symptoms since off allergy medications.   Mild intermittent asthma without complication ACT 23. Breathing is doing well with no albuterol use since the last visit.   Adverse food reaction Avoiding pineapples.   Assessment and Plan: Glenda Adams is a 23 y.o. female with: Other allergic rhinitis Past history - developed rhinitis and chest tightness for the past 2 weeks. Patient concerned about mold exposure as there was mold in her apartment for the past 2 months. Tried Flonase, antibiotics and zyrtec with some benefit.  Interim history - symptoms worse off antihistamines.   Today's skin testing showed: Positive to grass pollen and few seasonal molds.   Based on her timeline and skin test results concerned that grass pollen was driving her symptoms rather than her mold exposure.   Start environmental control measures.  May use Flonase 1 spray in each nostril 1-2 times a day for nasal congestion.  May use over the counter antihistamines such as Zyrtec (cetirizine), Claritin (loratadine), Allegra (fexofenadine), or Xyzal (levocetirizine) daily as needed.  Start Singulair 10mg  daily at night.  Cautioned that in some children/adults can experience behavioral changes including hyperactivity, agitation, depression, sleep disturbances and suicidal ideations. These side effects are rare, but if you notice them  you should notify me and discontinue Singulair (montelukast).  Mild intermittent asthma without complication Past history - Diagnosed with asthma over 10 years ago. Currently using albuterol less than once a week with good benefit. Main triggers are URIs. 2020 spirometry was normal. Interim history - Well controlled. ACT score 23.   Daily controller medication(s): None  Prior to physical activity: May use albuterol rescue inhaler 2 puffs 5 to 15 minutes prior to strenuous physical activities.  Rescue medications: May use albuterol rescue inhaler 2 puffs or nebulizer every 4 to 6 hours as needed for shortness of breath, chest tightness, coughing, and wheezing. Monitor frequency of use.   Adverse food reaction Past history - Perioral pruritus after pineapple ingestion.  Today's skin testing was negative to pineapple. She most likely is has sensitivity to the acidity of the pineapple.   Continue to avoid pineapple for now.  For mild symptoms you can take over the counter antihistamines such as Benadryl and monitor symptoms closely. If symptoms worsen or if you have severe symptoms including breathing issues, throat closure, significant swelling, whole body hives, severe diarrhea and vomiting, lightheadedness then seek immediate medical care.  Return in about 3 months (around 06/06/2019).  Meds ordered this encounter  Medications   montelukast (SINGULAIR) 10 MG tablet    Sig: Take 1 tablet (10 mg total) by mouth at bedtime.    Dispense:  30 tablet    Refill:  5   Diagnostics: Skin Testing: Environmental allergy panel and select foods. Positive test to: grass, few seasonal molds. Negative test to: pineapple.  Results discussed with patient/family. Airborne Adult Perc - 03/06/19 0913    Time Antigen  Placed  2778    Allergen Manufacturer  Greer    Location  Back    Number of Test  59    Panel 1  Select    1. Control-Buffer 50% Glycerol  Negative    2. Control-Histamine 1 mg/ml  2+     3. Albumin saline  Negative    4. Valdez  Negative    5. Guatemala  Negative    6. Johnson  --   +/-   7. Kentucky Blue  2+    8. Meadow Fescue  --   +/-   9. Perennial Rye  3+    10. Sweet Vernal  2+    11. Timothy  2+    12. Cocklebur  Negative    13. Burweed Marshelder  Negative    14. Ragweed, short  Negative    15. Ragweed, Giant  Negative    16. Plantain,  English  Negative    17. Lamb's Quarters  Negative    18. Sheep Sorrell  Negative    19. Rough Pigweed  Negative    20. Marsh Elder, Rough  Negative    21. Mugwort, Common  Negative    22. Ash mix  Negative    23. Birch mix  Negative    24. Beech American  Negative    25. Box, Elder  Negative    26. Cedar, red  Negative    27. Cottonwood, Russian Federation  Negative    28. Elm mix  Negative    29. Hickory mix  Negative    30. Maple mix  Negative    31. Oak, Russian [place] mix  Negative    32. Pecan Pollen  Negative    33. Pine mix  Negative    34. Sycamore Eastern  Negative    35. Cherry, Black Pollen  Negative    36. Alternaria alternata  Negative    37. Cladosporium Herbarum  Negative    38. Aspergillus mix  Negative    39. Penicillium mix  Negative    40. Bipolaris sorokiniana (Helminthosporium)  Negative    41. Drechslera spicifera (Curvularia)  Negative    42. Mucor plumbeus  Negative    43. Fusarium moniliforme  Negative    44. Aureobasidium pullulans (pullulara)  Negative    45. Rhizopus oryzae  Negative    46. Botrytis cinera  Negative    47. Epicoccum nigrum  Negative    48. Phoma betae  Negative    49. Candida Albicans  Negative    50. Trichophyton mentagrophytes  Negative    51. Mite, D Farinae  5,000 AU/ml  Negative    52. Mite, D Pteronyssinus  5,000 AU/ml  Negative    53. Cat Hair 10,000 BAU/ml  Negative    54.  Dog Epithelia  Negative    55. Mixed Feathers  Negative    56. Horse Epithelia  Negative    57. Cockroach, German  Negative    58. Mouse  Negative    59. Tobacco Leaf  Negative     Intradermal  - 03/06/19 0957    Time Antigen Placed  0957    Allergen Manufacturer  Lavella Hammock    Location  Arm    Number of Test  13    Intradermal  Select    Control  Negative    Guatemala  --   +/-   Ragweed mix  Negative    Weed mix  Negative    Tree mix  Negative    Mold 1  Negative    Mold 2  Negative    Mold 3  --   +/-   Mold 4  Negative    Cat  Negative    Dog  Negative    Cockroach  Negative    Mite mix  Negative     Food Adult Perc - 03/06/19 0900    Time Antigen Placed  16100914    Allergen Manufacturer  Waynette ButteryGreer    Location  Back    Number of allergen test  1     Control-buffer 50% Glycerol  Negative    Control-Histamine 1 mg/ml  2+    63. Pineapple  Negative       Medication List:  Current Outpatient Medications  Medication Sig Dispense Refill   albuterol (VENTOLIN HFA) 108 (90 Base) MCG/ACT inhaler Inhale 2 puffs into the lungs every 6 (six) hours as needed for wheezing or shortness of breath.     cetirizine (ZYRTEC) 10 MG tablet Take 10 mg by mouth daily.     etonogestrel-ethinyl estradiol (NUVARING) 0.12-0.015 MG/24HR vaginal ring Place 1 each vaginally every 28 (twenty-eight) days. Insert vaginally and leave in place for 3 consecutive weeks, then remove for 1 week.     phenylephrine (SUDAFED PE) 10 MG TABS tablet Take 10 mg by mouth every 4 (four) hours as needed.     montelukast (SINGULAIR) 10 MG tablet Take 1 tablet (10 mg total) by mouth at bedtime. 30 tablet 5   No current facility-administered medications for this visit.    Allergies: No Known Allergies I reviewed her past medical history, social history, family history, and environmental history and no significant changes have been reported from previous visit on 02/13/2019.  Review of Systems  Constitutional: Negative for appetite change, chills, fever and unexpected weight change.  HENT: Positive for congestion, postnasal drip and sore throat. Negative for rhinorrhea.   Eyes: Negative for itching.  Respiratory:  Negative for cough, chest tightness, shortness of breath and wheezing.   Cardiovascular: Negative for chest pain.  Gastrointestinal: Negative for abdominal pain.  Genitourinary: Negative for difficulty urinating.  Skin: Negative for rash.  Allergic/Immunologic: Positive for environmental allergies.  Neurological: Negative for headaches.   Objective: BP 116/76 (BP Location: Left Arm, Patient Position: Sitting, Cuff Size: Normal)    Pulse 80    Temp 98.1 F (36.7 C) (Temporal)    Resp 18    SpO2 97%  There is no height or weight on file to calculate BMI. Physical Exam  Constitutional: She is oriented to person, place, and time. She appears well-developed and well-nourished.  HENT:  Head: Normocephalic and atraumatic.  Right Ear: External ear normal.  Left Ear: External ear normal.  Nose: Nose normal.  Mouth/Throat: Oropharynx is clear and moist.  Eyes: Conjunctivae and EOM are normal.  Neck: Neck supple.  Cardiovascular: Normal rate, regular rhythm and normal heart sounds. Exam reveals no gallop and no friction rub.  No murmur heard. Pulmonary/Chest: Effort normal and breath sounds normal. She has no wheezes. She has no rales.  Abdominal: Soft.  Neurological: She is alert and oriented to person, place, and time.  Skin: Skin is warm. No rash noted.  Psychiatric: She has a normal mood and affect. Her behavior is normal.  Nursing note and vitals reviewed.  Previous notes and tests were reviewed. The plan was reviewed with the patient/family, and all questions/concerned were addressed.  It was my pleasure to see Glenda Adams today and participate  in her care. Please feel free to contact me with any questions or concerns.  Sincerely,  Wyline MoodYoon Kaylem Gidney, DO Allergy & Immunology  [facility] [place] office: [phone] [facility] office: [phone] [place] office: [phone]

## 2019-03-06 NOTE — Assessment & Plan Note (Signed)
Past history - developed rhinitis and chest tightness for the past 2 weeks. Patient concerned about mold exposure as there was mold in her apartment for the past 2 months. Tried Flonase, antibiotics and zyrtec with some benefit.  Interim history - symptoms worse off antihistamines.   Today's skin testing showed: Positive to grass pollen and few seasonal molds.   Based on her timeline and skin test results concerned that grass pollen was driving her symptoms rather than her mold exposure.   Start environmental control measures.  May use Flonase 1 spray in each nostril 1-2 times a day for nasal congestion.  May use over the counter antihistamines such as Zyrtec (cetirizine), Claritin (loratadine), Allegra (fexofenadine), or Xyzal (levocetirizine) daily as needed.  Start Singulair 10mg  daily at night.  Cautioned that in some children/adults can experience behavioral changes including hyperactivity, agitation, depression, sleep disturbances and suicidal ideations. These side effects are rare, but if you notice them you should notify me and discontinue Singulair (montelukast).

## 2019-03-06 NOTE — Assessment & Plan Note (Signed)
Past history - Perioral pruritus after pineapple ingestion.  Today's skin testing was negative to pineapple. She most likely is has sensitivity to the acidity of the pineapple.   Continue to avoid pineapple for now.  For mild symptoms you can take over the counter antihistamines such as Benadryl and monitor symptoms closely. If symptoms worsen or if you have severe symptoms including breathing issues, throat closure, significant swelling, whole body hives, severe diarrhea and vomiting, lightheadedness then seek immediate medical care.

## 2019-03-06 NOTE — Assessment & Plan Note (Signed)
Past history - Diagnosed with asthma over 10 years ago. Currently using albuterol less than once a week with good benefit. Main triggers are URIs. 2020 spirometry was normal. Interim history - Well controlled. ACT score 23.  . Daily controller medication(s): None . Prior to physical activity: May use albuterol rescue inhaler 2 puffs 5 to 15 minutes prior to strenuous physical activities. Marland Kitchen Rescue medications: May use albuterol rescue inhaler 2 puffs or nebulizer every 4 to 6 hours as needed for shortness of breath, chest tightness, coughing, and wheezing. Monitor frequency of use.

## 2019-03-06 NOTE — Patient Instructions (Addendum)
Today's skin testing showed: Positive to grass pollen and few molds.    Start environmental control measures.  May use Flonase 1 spray in each nostril 1-2 times a day for nasal congestion.  May use over the counter antihistamines such as Zyrtec (cetirizine), Claritin (loratadine), Allegra (fexofenadine), or Xyzal (levocetirizine) daily as needed.  Start singulair 10mg  daily at night.  Cautioned that in some children/adults can experience behavioral changes including hyperactivity, agitation, depression, sleep disturbances and suicidal ideations. These side effects are rare, but if you notice them you should notify me and discontinue Singulair (montelukast).  Mild intermittent asthma without complication  Daily controller medication(s):None  Prior to physical activity:May use albuterol rescue inhaler 2 puffs 5 to 15 minutes prior to strenuous physical activities.  Rescue medications:May use albuterol rescue inhaler 2 puffs or nebulizer every 4 to 6 hours as needed for shortness of breath, chest tightness, coughing, and wheezing. Monitor frequency of use.  Asthma control goals:  Full participation in all desired activities (may need albuterol before activity) Albuterol use two times or less a week on average (not counting use with activity) Cough interfering with sleep two times or less a month Oral steroids no more than once a year No hospitalizations  Adverse food reaction  Negative to pineapple. You may be just sensitive to it.   Continue to avoid pineapple.   For mild symptoms you can take over the counter antihistamines such as Benadryl and monitor symptoms closely. If symptoms worsen or if you have severe symptoms including breathing issues, throat closure, significant swelling, whole body hives, severe diarrhea and vomiting, lightheadedness then seek immediate medical care.  Follow up in 3 months or sooner if needed.  Reducing Pollen Exposure . Pollen seasons: trees  (spring), grass (summer) and ragweed/weeds (fall). Marland Kitchen Keep windows closed in your home and car to lower pollen exposure.  Susa Simmonds air conditioning in the bedroom and throughout the house if possible.  . Avoid going out in dry windy days - especially early morning. . Pollen counts are highest between 5 - 10 AM and on dry, hot and windy days.  . Save outside activities for late afternoon or after a heavy rain, when pollen levels are lower.  . Avoid mowing of grass if you have grass pollen allergy. Marland Kitchen Be aware that pollen can also be transported indoors on people and pets.  . Dry your clothes in an automatic dryer rather than hanging them outside where they might collect pollen.  . Rinse hair and eyes before bedtime. Mold Control . Mold and fungi can grow on a variety of surfaces provided certain temperature and moisture conditions exist.  . Outdoor molds grow on plants, decaying vegetation and soil. The major outdoor mold, Alternaria and Cladosporium, are found in very high numbers during hot and dry conditions. Generally, a late summer - fall peak is seen for common outdoor fungal spores. Rain will temporarily lower outdoor mold spore count, but counts rise rapidly when the rainy period ends. . The most important indoor molds are Aspergillus and Penicillium. Dark, humid and poorly ventilated basements are ideal sites for mold growth. The next most common sites of mold growth are the bathroom and the kitchen. Outdoor (Seasonal) Mold Control . Use air conditioning and keep windows closed. . Avoid exposure to decaying vegetation. Marland Kitchen Avoid leaf raking. . Avoid grain handling. . Consider wearing a face mask if working in moldy areas.  Indoor (Perennial) Mold Control  . Maintain humidity below 50%. . Get rid  of mold growth on hard surfaces with water, detergent and, if necessary, 5% bleach (do not mix with other cleaners). Then dry the area completely. If mold covers an area more than 10 square feet,  consider hiring an indoor environmental professional. . For clothing, washing with soap and water is best. If moldy items cannot be cleaned and dried, throw them away. . Remove sources e.g. contaminated carpets. . Repair and seal leaking roofs or pipes. Using dehumidifiers in damp basements may be helpful, but empty the water and clean units regularly to prevent mildew from forming. All rooms, especially basements, bathrooms and kitchens, require ventilation and cleaning to deter mold and mildew growth. Avoid carpeting on concrete or damp floors, and storing items in damp areas.

## 2019-03-07 DIAGNOSIS — N898 Other specified noninflammatory disorders of vagina: Secondary | ICD-10-CM | POA: Diagnosis not present

## 2019-03-07 DIAGNOSIS — Z01419 Encounter for gynecological examination (general) (routine) without abnormal findings: Secondary | ICD-10-CM | POA: Diagnosis not present

## 2019-03-25 DIAGNOSIS — Z3042 Encounter for surveillance of injectable contraceptive: Secondary | ICD-10-CM | POA: Diagnosis not present

## 2019-03-27 DIAGNOSIS — N926 Irregular menstruation, unspecified: Secondary | ICD-10-CM | POA: Diagnosis not present

## 2019-04-29 DIAGNOSIS — Z113 Encounter for screening for infections with a predominantly sexual mode of transmission: Secondary | ICD-10-CM | POA: Diagnosis not present

## 2019-04-29 DIAGNOSIS — B373 Candidiasis of vulva and vagina: Secondary | ICD-10-CM | POA: Diagnosis not present

## 2019-04-29 DIAGNOSIS — N760 Acute vaginitis: Secondary | ICD-10-CM | POA: Diagnosis not present

## 2019-05-28 DIAGNOSIS — N938 Other specified abnormal uterine and vaginal bleeding: Secondary | ICD-10-CM | POA: Diagnosis not present

## 2019-05-28 DIAGNOSIS — N926 Irregular menstruation, unspecified: Secondary | ICD-10-CM | POA: Diagnosis not present

## 2019-06-11 DIAGNOSIS — N938 Other specified abnormal uterine and vaginal bleeding: Secondary | ICD-10-CM | POA: Diagnosis not present

## 2019-06-11 DIAGNOSIS — N898 Other specified noninflammatory disorders of vagina: Secondary | ICD-10-CM | POA: Diagnosis not present

## 2019-06-24 DIAGNOSIS — Z20828 Contact with and (suspected) exposure to other viral communicable diseases: Secondary | ICD-10-CM | POA: Diagnosis not present

## 2019-09-05 DIAGNOSIS — Z3009 Encounter for other general counseling and advice on contraception: Secondary | ICD-10-CM | POA: Diagnosis not present

## 2019-09-05 DIAGNOSIS — N760 Acute vaginitis: Secondary | ICD-10-CM | POA: Diagnosis not present

## 2019-09-05 DIAGNOSIS — R350 Frequency of micturition: Secondary | ICD-10-CM | POA: Diagnosis not present

## 2019-09-05 DIAGNOSIS — Z113 Encounter for screening for infections with a predominantly sexual mode of transmission: Secondary | ICD-10-CM | POA: Diagnosis not present

## 2019-11-03 ENCOUNTER — Encounter (HOSPITAL_COMMUNITY): Payer: Self-pay

## 2019-11-03 ENCOUNTER — Ambulatory Visit (INDEPENDENT_AMBULATORY_CARE_PROVIDER_SITE_OTHER): Payer: Federal, State, Local not specified - PPO

## 2019-11-03 ENCOUNTER — Other Ambulatory Visit: Payer: Self-pay

## 2019-11-03 ENCOUNTER — Ambulatory Visit (HOSPITAL_COMMUNITY)
Admission: EM | Admit: 2019-11-03 | Discharge: 2019-11-03 | Disposition: A | Discharge status: Home / Self Care | Payer: Federal, State, Local not specified - PPO

## 2019-11-03 DIAGNOSIS — S60211A Contusion of right wrist, initial encounter: Secondary | ICD-10-CM | POA: Diagnosis not present

## 2019-11-03 DIAGNOSIS — M25531 Pain in right wrist: Secondary | ICD-10-CM | POA: Diagnosis not present

## 2019-11-03 DIAGNOSIS — S6991XA Unspecified injury of right wrist, hand and finger(s), initial encounter: Secondary | ICD-10-CM

## 2019-11-03 DIAGNOSIS — M79641 Pain in right hand: Secondary | ICD-10-CM

## 2019-11-03 MED ORDER — IBUPROFEN 600 MG PO TABS: 600.0000 mg | ORAL_TABLET | Freq: Three times a day (TID) | ORAL | 0 refills | Status: DC | PRN

## 2019-11-03 MED ORDER — IBUPROFEN 800 MG PO TABS
ORAL_TABLET | ORAL | Status: AC
  Filled 2019-11-03: qty 1

## 2019-11-03 MED ORDER — IBUPROFEN 800 MG PO TABS
800.0000 mg | ORAL_TABLET | Freq: Once | ORAL | Status: AC
  Administered 2019-11-03: 18:00:00 800 mg via ORAL

## 2019-11-03 NOTE — ED Triage Notes (Signed)
Pt is here with wrist pain since she tired to catch her fall on Thursday, states its hard to hold a fork. Pt has taken Tylenol & ice to relieve discomfort.
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
## 2019-11-18 DIAGNOSIS — N63 Unspecified lump in unspecified breast: Secondary | ICD-10-CM | POA: Diagnosis not present

## 2019-12-15 DIAGNOSIS — Z03818 Encounter for observation for suspected exposure to other biological agents ruled out: Secondary | ICD-10-CM | POA: Diagnosis not present

## 2019-12-15 DIAGNOSIS — Z20828 Contact with and (suspected) exposure to other viral communicable diseases: Secondary | ICD-10-CM | POA: Diagnosis not present

## 2019-12-24 DIAGNOSIS — Z113 Encounter for screening for infections with a predominantly sexual mode of transmission: Secondary | ICD-10-CM | POA: Diagnosis not present

## 2020-01-22 ENCOUNTER — Ambulatory Visit (HOSPITAL_COMMUNITY)
Admission: EM | Admit: 2020-01-22 | Discharge: 2020-01-22 | Disposition: A | Discharge status: Home / Self Care | Payer: Federal, State, Local not specified - PPO | Attending: Family Medicine | Admitting: Family Medicine

## 2020-01-22 ENCOUNTER — Ambulatory Visit (INDEPENDENT_AMBULATORY_CARE_PROVIDER_SITE_OTHER): Payer: Federal, State, Local not specified - PPO

## 2020-01-22 ENCOUNTER — Encounter (HOSPITAL_COMMUNITY): Payer: Self-pay

## 2020-01-22 ENCOUNTER — Other Ambulatory Visit: Payer: Self-pay

## 2020-01-22 DIAGNOSIS — Z3202 Encounter for pregnancy test, result negative: Secondary | ICD-10-CM | POA: Diagnosis not present

## 2020-01-22 DIAGNOSIS — Z0189 Encounter for other specified special examinations: Secondary | ICD-10-CM

## 2020-01-22 DIAGNOSIS — Z8709 Personal history of other diseases of the respiratory system: Secondary | ICD-10-CM | POA: Diagnosis not present

## 2020-01-22 DIAGNOSIS — J45909 Unspecified asthma, uncomplicated: Secondary | ICD-10-CM | POA: Diagnosis not present

## 2020-01-22 DIAGNOSIS — Z01818 Encounter for other preprocedural examination: Secondary | ICD-10-CM | POA: Diagnosis not present

## 2020-01-22 LAB — POC URINE PREG, ED: Preg Test, Ur: NEGATIVE

## 2020-01-22 NOTE — ED Triage Notes (Signed)
Pt presents for surgical clearance. Pt needs EKG and chest x ray, for breast augmentation surgery 02/24/2020. Pt denies any chest pain, shortness of breath, fever, chills.

## 2020-01-22 NOTE — Discharge Instructions (Addendum)
EKG and Chest xray normal Please follow up with Primary care for full clearance for surgery

## 2020-01-23 NOTE — ED Provider Notes (Signed)
[facility]    CSN: [id number] Arrival date & time: 01/22/20  1333      History   Chief Complaint Chief Complaint  Patient presents with  . Medical Clearance    HPI Glenda Adams is a 24 y.o. female presenting today for EKG and chest x-ray.  Patient has upcoming breast augmentation surgery planned for 02/24/2020.  Surgery will be performed in [place].  She reports that she is needing an EKG and chest x-ray prior to her surgery.  She does not have primary care.  Denies chest pain or shortness of breath.   HPI  Past Medical History:  Diagnosis Date  . Asthma   . Eczema   . Urticaria     Patient Active Problem List   Diagnosis Date Noted  . Other allergic rhinitis 03/06/2019  . Mild intermittent asthma without complication 02/14/2019  . Mold exposure 02/14/2019  . Adverse food reaction 02/14/2019  . Anal fissure 06/01/2016  . Rectal bleeding 06/01/2016    Past Surgical History:  Procedure Laterality Date  . ARTHROSCOPIC REPAIR ACL Left 2017  . TYMPANOSTOMY TUBE PLACEMENT      OB History    Gravida  1   Para      Term      Preterm      AB      Living        SAB      TAB      Ectopic      Multiple      Live Births               Home Medications    Prior to Admission medications   Medication Sig Start Date End Date Taking? Authorizing Provider  Ascorbic Acid (VITAMIN C) 1000 MG tablet Take 1,000 mg by mouth daily.   Yes [provider]  ELDERBERRY PO Take by mouth.   Yes [provider]  NON FORMULARY [employer]   Yes [provider]  albuterol (VENTOLIN HFA) 108 (90 Base) MCG/ACT inhaler Inhale 2 puffs into the lungs every 6 (six) hours as needed for wheezing or shortness of breath.    [provider]  cetirizine (ZYRTEC) 10 MG tablet Take 10 mg by mouth daily.    [provider]  cetirizine (ZYRTEC) 5 MG tablet Take by mouth.    [provider]  etonogestrel-ethinyl  estradiol (NUVARING) 0.12-0.015 MG/24HR vaginal ring Place 1 each vaginally every 28 (twenty-eight) days. Insert vaginally and leave in place for 3 consecutive weeks, then remove for 1 week.    [provider]  fluconazole (DIFLUCAN) 150 MG tablet 1 TABLET NOW AND REPEAT 4 DAYS AS DIRECTED ORALLY 1 DOSE 05/28/19   [provider]  fluticasone (FLONASE) 50 MCG/ACT nasal spray Place into the nose.    [provider]  ibuprofen (ADVIL) 600 MG tablet Take 1 tablet (600 mg total) by mouth every 8 (eight) hours as needed. 11/03/19   Wallis Bamberg, PA-C  metroNIDAZOLE (FLAGYL) 500 MG tablet Take 500 mg by mouth 2 (two) times daily. 09/05/19   [provider]  montelukast (SINGULAIR) 10 MG tablet Take 1 tablet (10 mg total) by mouth at bedtime. 03/06/19   Ellamae Sia, DO  phenylephrine (SUDAFED PE) 10 MG TABS tablet Take 10 mg by mouth every 4 (four) hours as needed.    [provider]  Burr Medico 150-35 MCG/24HR transdermal patch PLEASE SEE ATTACHED FOR DETAILED DIRECTIONS 09/14/19   [provider]    Family History Family History  Problem Relation Age of Onset  . Allergic rhinitis Mother   . Heart murmur Mother   . Asthma Sister     Social History Social History   Tobacco Use  . Smoking status: Never Smoker  . Smokeless tobacco: Never Used  Vaping Use  . Vaping Use: Never used  Substance Use Topics  . Alcohol use: Yes  . Drug use: Yes    Types: Marijuana     Allergies   Patient has no known allergies.   Review of Systems Review of Systems  Constitutional: Negative for activity change, appetite change, chills, fatigue and fever.  HENT: Negative for congestion, ear pain, rhinorrhea, sinus pressure, sore throat and trouble swallowing.   Eyes: Negative for discharge and redness.  Respiratory: Negative for cough, chest tightness and shortness of breath.   Cardiovascular: Negative for chest pain.  Gastrointestinal: Negative for abdominal  pain, diarrhea, nausea and vomiting.  Musculoskeletal: Negative for myalgias.  Skin: Negative for rash.  Neurological: Negative for dizziness, light-headedness and headaches.     Physical Exam Triage Vital Signs ED Triage Vitals  Enc Vitals Group     BP 01/22/20 1451 120/72     Pulse Rate 01/22/20 1451 82     Resp 01/22/20 1451 17     Temp 01/22/20 1451 98.6 F (37 C)     Temp Source 01/22/20 1451 Oral     SpO2 01/22/20 1451 99 %     Weight --      Height --      Head Circumference --      Peak Flow --      Pain Score 01/22/20 1446 0     Pain Loc --      Pain Edu? --      Excl. in GC? --    No data found.  Updated Vital Signs BP 120/72 (BP Location: Right Arm)   Pulse 82   Temp 98.6 F (37 C) (Oral)   Resp 17   LMP 01/09/2020 (Exact Date)   SpO2 99%   Visual Acuity Right Eye Distance:   Left Eye Distance:   Bilateral Distance:    Right Eye Near:   Left Eye Near:    Bilateral Near:     Physical Exam Vitals and nursing note reviewed.  Constitutional:      Appearance: She is well-developed.     Comments: No acute distress  HENT:     Head: Normocephalic and atraumatic.     Nose: Nose normal.  Eyes:     Conjunctiva/sclera: Conjunctivae normal.  Cardiovascular:     Rate and Rhythm: Normal rate.  Pulmonary:     Effort: Pulmonary effort is normal. No respiratory distress.     Comments: Breathing comfortably at rest, CTABL, no wheezing, rales or other adventitious sounds auscultated Abdominal:     General: There is no distension.  Musculoskeletal:        General: Normal range of motion.     Cervical back: Neck supple.  Skin:    General: Skin is warm and dry.  Neurological:     Mental Status: She is alert and oriented to person, place, and time.      UC Treatments / Results  Labs (all labs ordered are listed, but only abnormal results are displayed) Labs Reviewed  POC URINE PREG, ED    EKG   Radiology DG Chest 2 View  Result Date:  01/22/2020 CLINICAL DATA:  Surgical clearance history of asthma EXAM: CHEST - 2 VIEW COMPARISON:  None. FINDINGS: The heart size and mediastinal contours are within normal limits. Both lungs are clear. Mild scoliosis of the spine. IMPRESSION: No active cardiopulmonary disease. Electronically Signed   By: Jasmine Pang M.D.   On: 01/22/2020 15:37    Procedures Procedures (including critical care time)  Medications Ordered in UC Medications - No data to display  Initial Impression / Assessment and Plan / UC Course  I have reviewed the triage vital signs and the nursing notes.  Pertinent labs & imaging results that were available during my care of the patient were reviewed by me and considered in my medical decision making (see chart for details).     EKG normal sinus rhythm, no acute signs of ischemia or infarction.  Chest x-ray unremarkable.  Discussed with patient unable to clear for surgery, this must be done by PCP, but will obtain diagnostic testing today that she is needing.  Discussed strict return precautions. Patient verbalized understanding and is agreeable with plan.  Final Clinical Impressions(s) / UC Diagnoses   Final diagnoses:  Patient request for diagnostic testing     Discharge Instructions     EKG and Chest xray normal Please follow up with Primary care for full clearance for surgery   ED Prescriptions    None     PDMP not reviewed this encounter.   Lew Dawes, [place] 01/23/20 1656

## 2020-01-28 DIAGNOSIS — Z411 Encounter for cosmetic surgery: Secondary | ICD-10-CM | POA: Diagnosis not present

## 2020-01-28 DIAGNOSIS — Z01818 Encounter for other preprocedural examination: Secondary | ICD-10-CM | POA: Diagnosis not present

## 2020-02-17 DIAGNOSIS — Z20822 Contact with and (suspected) exposure to covid-19: Secondary | ICD-10-CM | POA: Diagnosis not present

## 2020-03-09 DIAGNOSIS — Z01419 Encounter for gynecological examination (general) (routine) without abnormal findings: Secondary | ICD-10-CM | POA: Diagnosis not present

## 2020-03-20 DIAGNOSIS — M7989 Other specified soft tissue disorders: Secondary | ICD-10-CM | POA: Diagnosis not present

## 2020-04-07 DIAGNOSIS — J069 Acute upper respiratory infection, unspecified: Secondary | ICD-10-CM | POA: Diagnosis not present

## 2020-04-29 DIAGNOSIS — J019 Acute sinusitis, unspecified: Secondary | ICD-10-CM | POA: Diagnosis not present

## 2020-05-20 DIAGNOSIS — Z113 Encounter for screening for infections with a predominantly sexual mode of transmission: Secondary | ICD-10-CM | POA: Diagnosis not present

## 2020-05-25 DIAGNOSIS — H10022 Other mucopurulent conjunctivitis, left eye: Secondary | ICD-10-CM | POA: Diagnosis not present

## 2020-06-02 DIAGNOSIS — N760 Acute vaginitis: Secondary | ICD-10-CM | POA: Diagnosis not present

## 2020-06-02 DIAGNOSIS — Z113 Encounter for screening for infections with a predominantly sexual mode of transmission: Secondary | ICD-10-CM | POA: Diagnosis not present

## 2020-06-16 DIAGNOSIS — Z20822 Contact with and (suspected) exposure to covid-19: Secondary | ICD-10-CM | POA: Diagnosis not present

## 2020-07-15 DIAGNOSIS — N760 Acute vaginitis: Secondary | ICD-10-CM | POA: Diagnosis not present

## 2020-07-15 DIAGNOSIS — B373 Candidiasis of vulva and vagina: Secondary | ICD-10-CM | POA: Diagnosis not present

## 2020-07-15 DIAGNOSIS — Z114 Encounter for screening for human immunodeficiency virus [HIV]: Secondary | ICD-10-CM | POA: Diagnosis not present

## 2020-07-15 DIAGNOSIS — Z113 Encounter for screening for infections with a predominantly sexual mode of transmission: Secondary | ICD-10-CM | POA: Diagnosis not present

## 2020-11-14 ENCOUNTER — Ambulatory Visit (HOSPITAL_COMMUNITY)
Admission: EM | Admit: 2020-11-14 | Discharge: 2020-11-14 | Disposition: A | Discharge status: Home / Self Care | Payer: Federal, State, Local not specified - PPO | Attending: Internal Medicine | Admitting: Internal Medicine

## 2020-11-14 ENCOUNTER — Encounter (HOSPITAL_COMMUNITY): Payer: Self-pay | Admitting: Emergency Medicine

## 2020-11-14 ENCOUNTER — Other Ambulatory Visit: Payer: Self-pay

## 2020-11-14 DIAGNOSIS — Z3202 Encounter for pregnancy test, result negative: Secondary | ICD-10-CM | POA: Diagnosis not present

## 2020-11-14 DIAGNOSIS — R110 Nausea: Secondary | ICD-10-CM | POA: Diagnosis not present

## 2020-11-14 DIAGNOSIS — R509 Fever, unspecified: Secondary | ICD-10-CM | POA: Insufficient documentation

## 2020-11-14 DIAGNOSIS — U071 COVID-19: Secondary | ICD-10-CM | POA: Insufficient documentation

## 2020-11-14 DIAGNOSIS — R109 Unspecified abdominal pain: Secondary | ICD-10-CM | POA: Diagnosis not present

## 2020-11-14 DIAGNOSIS — J4520 Mild intermittent asthma, uncomplicated: Secondary | ICD-10-CM | POA: Diagnosis not present

## 2020-11-14 DIAGNOSIS — Z79899 Other long term (current) drug therapy: Secondary | ICD-10-CM | POA: Diagnosis not present

## 2020-11-14 DIAGNOSIS — J069 Acute upper respiratory infection, unspecified: Secondary | ICD-10-CM | POA: Diagnosis not present

## 2020-11-14 DIAGNOSIS — R0981 Nasal congestion: Secondary | ICD-10-CM | POA: Diagnosis not present

## 2020-11-14 LAB — POCT URINALYSIS DIPSTICK, ED / UC
Bilirubin Urine: NEGATIVE
Glucose, UA: NEGATIVE mg/dL
Leukocytes,Ua: NEGATIVE
Nitrite: NEGATIVE
Protein, ur: NEGATIVE mg/dL
Specific Gravity, Urine: 1.015 (ref 1.005–1.030)
Urobilinogen, UA: 0.2 mg/dL (ref 0.0–1.0)
pH: 7 (ref 5.0–8.0)

## 2020-11-14 LAB — SARS CORONAVIRUS 2 (TAT 6-24 HRS): SARS Coronavirus 2: POSITIVE — AB

## 2020-11-14 LAB — POC URINE PREG, ED: Preg Test, Ur: NEGATIVE

## 2020-11-14 MED ORDER — PROMETHAZINE-DM 6.25-15 MG/5ML PO SYRP: 5.0000 mL | ORAL_SOLUTION | Freq: Four times a day (QID) | ORAL | 0 refills | Status: DC | PRN

## 2020-11-14 NOTE — ED Triage Notes (Signed)
Pt presents today with c/o of nasal congestion, cough, fever, abdominal pain and nausea x 3 days.

## 2020-11-15 ENCOUNTER — Telehealth: Payer: Self-pay | Admitting: Infectious Diseases

## 2020-11-15 NOTE — Telephone Encounter (Signed)
Called to discuss with patient about COVID-19 symptoms and the use of one of the available treatments for those with mild to moderate Covid symptoms and at a high risk of hospitalization.  Pt appears to qualify for outpatient treatment due to co-morbid conditions and/or a member of an at-risk group in accordance with the FDA Emergency Use Authorization.    Symptom onset: 4/21 Vaccinated: unknown Booster?  Immunocompromised? no Qualifiers: moderate asthma  NIH Criteria: 1  Unable to reach pt - I sent a mychart message to her as I was not able to leave a voicemail. Seems like a good molnupiravir candidate if not pregnant.    Rexene Alberts

## 2020-11-18 NOTE — ED Provider Notes (Signed)
[facility]    CSN: [id number] Arrival date & time: 11/14/20  1236      History   Chief Complaint Chief Complaint  Patient presents with  . Fever  . Nasal Congestion  . Cough  . Abdominal Pain  . Nausea    HPI Glenda Adams is a 25 y.o. female.   Patient here today with 3 day history of congestion, cough, fever, nausea, fatigue. Denies CP, SOB, HAs, dizziness, vomiting, diarrhea, body aches. Taking OTC cold and flu medications with mild temporary relief. Hx of seasonal allergies and asthma for which she takes zyrtec, singulair, and albuterol prn. No known sick contacts recently.      Past Medical History:  Diagnosis Date  . Asthma   . Eczema   . Urticaria     Patient Active Problem List   Diagnosis Date Noted  . Other allergic rhinitis 03/06/2019  . Mild intermittent asthma without complication 02/14/2019  . Mold exposure 02/14/2019  . Adverse food reaction 02/14/2019  . Anal fissure 06/01/2016  . Rectal bleeding 06/01/2016    Past Surgical History:  Procedure Laterality Date  . ARTHROSCOPIC REPAIR ACL Left 2017  . TYMPANOSTOMY TUBE PLACEMENT      OB History    Gravida  1   Para      Term      Preterm      AB      Living        SAB      IAB      Ectopic      Multiple      Live Births               Home Medications    Prior to Admission medications   Medication Sig Start Date End Date Taking? Authorizing Provider  albuterol (VENTOLIN HFA) 108 (90 Base) MCG/ACT inhaler Inhale 2 puffs into the lungs every 6 (six) hours as needed for wheezing or shortness of breath.   Yes [provider]  montelukast (SINGULAIR) 10 MG tablet Take 1 tablet (10 mg total) by mouth at bedtime. 03/06/19  Yes Ellamae Sia, DO  promethazine-dextromethorphan (PROMETHAZINE-DM) 6.25-15 MG/5ML syrup Take 5 mLs by mouth 4 (four) times daily as needed for cough. 11/14/20  Yes Particia Nearing, PA-C  Ascorbic Acid (VITAMIN C) 1000 MG tablet  Take 1,000 mg by mouth daily.    [provider]  cetirizine (ZYRTEC) 10 MG tablet Take 10 mg by mouth daily.    [provider]  cetirizine (ZYRTEC) 5 MG tablet Take by mouth.    [provider]  ELDERBERRY PO Take by mouth.    [provider]  etonogestrel-ethinyl estradiol (NUVARING) 0.12-0.015 MG/24HR vaginal ring Place 1 each vaginally every 28 (twenty-eight) days. Insert vaginally and leave in place for 3 consecutive weeks, then remove for 1 week.    [provider]  fluconazole (DIFLUCAN) 150 MG tablet 1 TABLET NOW AND REPEAT 4 DAYS AS DIRECTED ORALLY 1 DOSE 05/28/19   [provider]  fluticasone (FLONASE) 50 MCG/ACT nasal spray Place into the nose.    [provider]  ibuprofen (ADVIL) 600 MG tablet Take 1 tablet (600 mg total) by mouth every 8 (eight) hours as needed. 11/03/19   Wallis Bamberg, PA-C  metroNIDAZOLE (FLAGYL) 500 MG tablet Take 500 mg by mouth 2 (two) times daily. 09/05/19   [provider]  NON FORMULARY [employer]    [provider]  phenylephrine (  SUDAFED PE) 10 MG TABS tablet Take 10 mg by mouth every 4 (four) hours as needed.    [provider]  Burr Medico 150-35 MCG/24HR transdermal patch PLEASE SEE ATTACHED FOR DETAILED DIRECTIONS 09/14/19   [provider]    Family History Family History  Problem Relation Age of Onset  . Allergic rhinitis Mother   . Heart murmur Mother   . Asthma Sister     Social History Social History   Tobacco Use  . Smoking status: Never Smoker  . Smokeless tobacco: Never Used  Vaping Use  . Vaping Use: Never used  Substance Use Topics  . Alcohol use: Yes  . Drug use: Yes    Types: Marijuana     Allergies   Patient has no known allergies.   Review of Systems Review of Systems PER HPI    Physical Exam Triage Vital Signs ED Triage Vitals  Enc Vitals Group     BP 11/14/20 1333 121/80     Pulse Rate 11/14/20 1333 (!)  121     Resp 11/14/20 1333 18     Temp 11/14/20 1333 (!) 100.5 F (38.1 C)     Temp Source 11/14/20 1333 Oral     SpO2 11/14/20 1333 97 %     Weight --      Height --      Head Circumference --      Peak Flow --      Pain Score 11/14/20 1329 6     Pain Loc --      Pain Edu? --      Excl. in GC? --    No data found.  Updated Vital Signs BP 121/80 (BP Location: Left Arm)   Pulse (!) 121   Temp (!) 100.5 F (38.1 C) (Oral)   Resp 18   LMP  (LMP Unknown)   SpO2 97%   Visual Acuity Right Eye Distance:   Left Eye Distance:   Bilateral Distance:    Right Eye Near:   Left Eye Near:    Bilateral Near:     Physical Exam Vitals and nursing note reviewed.  Constitutional:      Appearance: Normal appearance.  HENT:     Head: Atraumatic.     Right Ear: Tympanic membrane and external ear normal.     Left Ear: Tympanic membrane and external ear normal.     Nose: Congestion present.     Mouth/Throat:     Mouth: Mucous membranes are moist.     Pharynx: Posterior oropharyngeal erythema present.  Eyes:     Extraocular Movements: Extraocular movements intact.     Conjunctiva/sclera: Conjunctivae normal.  Cardiovascular:     Rate and Rhythm: Normal rate and regular rhythm.     Heart sounds: Normal heart sounds.  Pulmonary:     Effort: Pulmonary effort is normal.     Breath sounds: Normal breath sounds. No wheezing.  Abdominal:     General: Bowel sounds are normal. There is no distension.     Palpations: Abdomen is soft.     Tenderness: There is no abdominal tenderness. There is no guarding.  Musculoskeletal:        General: Normal range of motion.     Cervical back: Normal range of motion and neck supple.  Skin:    General: Skin is warm and dry.  Neurological:     Mental Status: She is alert and oriented to person, place, and time.  Psychiatric:  Mood and Affect: Mood normal.        Thought Content: Thought content normal.      UC Treatments / Results   Labs (all labs ordered are listed, but only abnormal results are displayed) Labs Reviewed  SARS CORONAVIRUS 2 (TAT 6-24 HRS) - Abnormal; Notable for the following components:      Result Value   SARS Coronavirus 2 POSITIVE (*)    All other components within normal limits  POCT URINALYSIS DIPSTICK, ED / UC - Abnormal; Notable for the following components:   Ketones, ur TRACE (*)    Hgb urine dipstick MODERATE (*)    All other components within normal limits  POC URINE PREG, ED    EKG   Radiology No results found.  Procedures Procedures (including critical care time)  Medications Ordered in UC Medications - No data to display  Initial Impression / Assessment and Plan / UC Course  I have reviewed the triage vital signs and the nursing notes.  Pertinent labs & imaging results that were available during my care of the patient were reviewed by me and considered in my medical decision making (see chart for details).     Suspicious for COVID 19, COVID pcr pending, phenergan DM sent to take in addition to OTC fever reducers and dayquil, nyquil. Push fluids, brat diet, isolation reviewed. Note given. Return for acutely worsening sxs.   Final Clinical Impressions(s) / UC Diagnoses   Final diagnoses:  Viral URI with cough  Fever, unspecified fever cause   Discharge Instructions   None    ED Prescriptions    Medication Sig Dispense Auth. Provider   promethazine-dextromethorphan (PROMETHAZINE-DM) 6.25-15 MG/5ML syrup Take 5 mLs by mouth 4 (four) times daily as needed for cough. 100 mL Particia Nearing, [place]     PDMP not reviewed this encounter.   Karianna, Gusman Blair, [place] 11/18/20 [phone]

## 2020-11-20 DIAGNOSIS — Z20822 Contact with and (suspected) exposure to covid-19: Secondary | ICD-10-CM | POA: Diagnosis not present

## 2021-03-16 DIAGNOSIS — N941 Unspecified dyspareunia: Secondary | ICD-10-CM | POA: Diagnosis not present

## 2021-03-16 DIAGNOSIS — Z01419 Encounter for gynecological examination (general) (routine) without abnormal findings: Secondary | ICD-10-CM | POA: Diagnosis not present

## 2021-03-16 DIAGNOSIS — Z113 Encounter for screening for infections with a predominantly sexual mode of transmission: Secondary | ICD-10-CM | POA: Diagnosis not present

## 2021-04-06 DIAGNOSIS — N941 Unspecified dyspareunia: Secondary | ICD-10-CM | POA: Diagnosis not present

## 2021-04-08 IMAGING — DX DG WRIST COMPLETE 3+V*R*
4 series · 4 of 4 positions shown · non-contrast
Comparison: None.

CLINICAL DATA: Pain after trauma.

EXAM:
RIGHT WRIST - COMPLETE 3+ VIEW

[wrist pa]
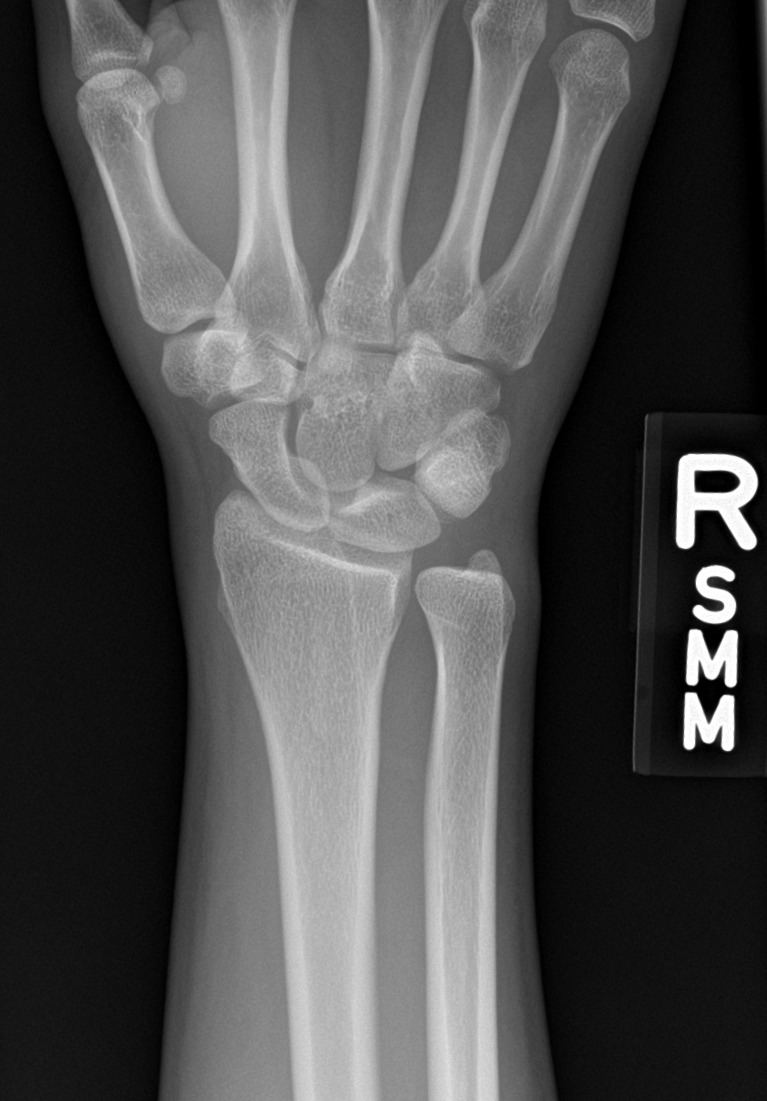

[wrist navicular]
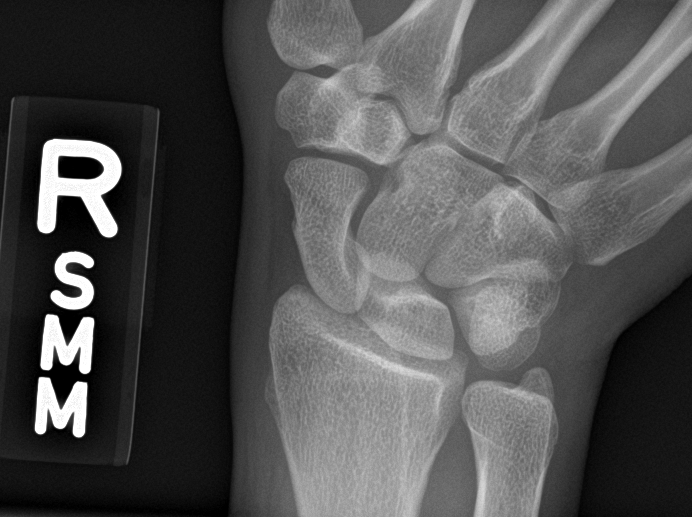

[wrist obl]
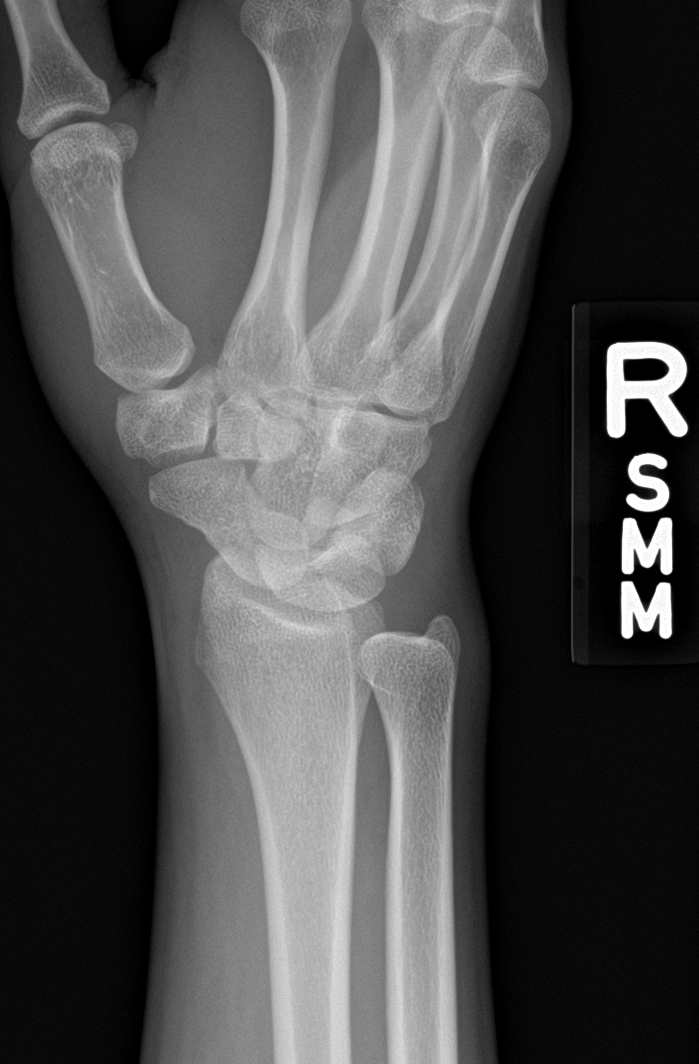

[wrist lat]
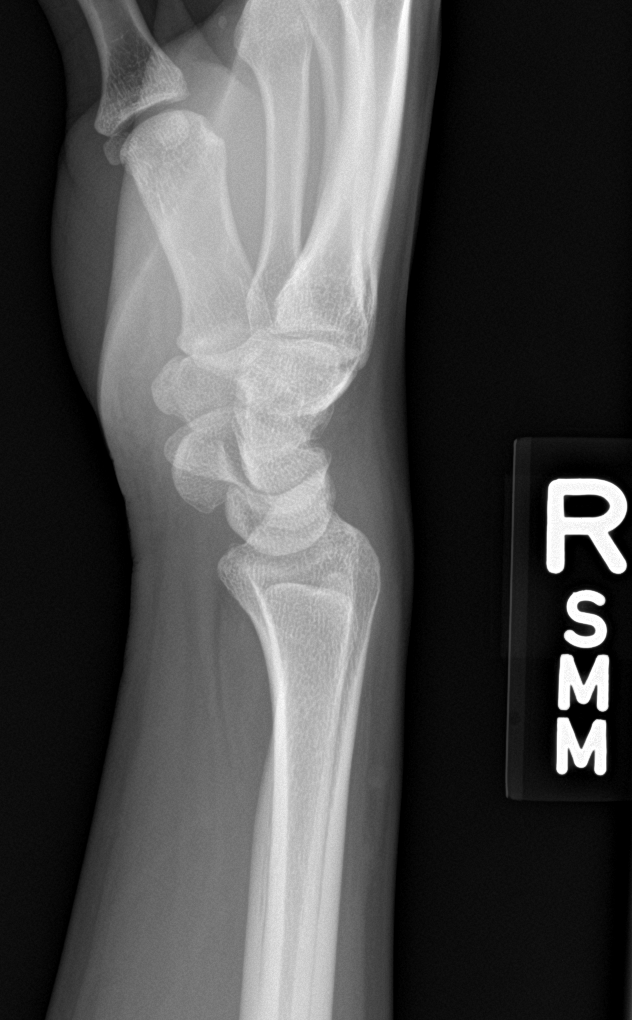

[4 of 4 positions shown; findings below may reference images not displayed]

FINDINGS: There is no evidence of fracture or dislocation. There is no
evidence of arthropathy or other focal bone abnormality. Soft
tissues are unremarkable.
IMPRESSION: Negative.

## 2021-06-03 ENCOUNTER — Ambulatory Visit: Payer: Self-pay

## 2021-06-23 DIAGNOSIS — Z202 Contact with and (suspected) exposure to infections with a predominantly sexual mode of transmission: Secondary | ICD-10-CM | POA: Diagnosis not present

## 2021-06-23 DIAGNOSIS — N926 Irregular menstruation, unspecified: Secondary | ICD-10-CM | POA: Diagnosis not present

## 2021-06-23 DIAGNOSIS — N83202 Unspecified ovarian cyst, left side: Secondary | ICD-10-CM | POA: Diagnosis not present

## 2021-06-23 DIAGNOSIS — Z3202 Encounter for pregnancy test, result negative: Secondary | ICD-10-CM | POA: Diagnosis not present

## 2021-06-23 DIAGNOSIS — N898 Other specified noninflammatory disorders of vagina: Secondary | ICD-10-CM | POA: Diagnosis not present

## 2021-10-22 ENCOUNTER — Ambulatory Visit
Admission: EM | Admit: 2021-10-22 | Discharge: 2021-10-22 | Disposition: A | Discharge status: Home / Self Care | Payer: Federal, State, Local not specified - PPO

## 2021-10-22 ENCOUNTER — Other Ambulatory Visit: Payer: Self-pay

## 2021-10-22 ENCOUNTER — Encounter: Payer: Self-pay | Admitting: Emergency Medicine

## 2021-10-22 DIAGNOSIS — R0981 Nasal congestion: Secondary | ICD-10-CM | POA: Diagnosis not present

## 2021-10-22 DIAGNOSIS — J019 Acute sinusitis, unspecified: Secondary | ICD-10-CM

## 2021-10-22 DIAGNOSIS — R051 Acute cough: Secondary | ICD-10-CM | POA: Diagnosis not present

## 2021-10-22 DIAGNOSIS — H6692 Otitis media, unspecified, left ear: Secondary | ICD-10-CM | POA: Diagnosis not present

## 2021-10-22 MED ORDER — AMOXICILLIN-POT CLAVULANATE 875-125 MG PO TABS: 1.0000 | ORAL_TABLET | Freq: Two times a day (BID) | ORAL | 0 refills | Status: AC

## 2021-10-22 MED ORDER — FLUCONAZOLE 150 MG PO TABS: ORAL_TABLET | ORAL | 0 refills | Status: DC

## 2021-10-22 MED ORDER — IPRATROPIUM BROMIDE 0.06 % NA SOLN: 2.0000 | Freq: Four times a day (QID) | NASAL | 0 refills | Status: DC

## 2021-10-22 NOTE — ED Provider Notes (Signed)
?[facility] ? ? ? ?CSN: [id number] ?Arrival date & time: 10/22/21  1452 ? ? ?  ? ?History   ?Chief Complaint ?Chief Complaint  ?Patient presents with  ? Cough  ? Otalgia  ?  left  ? ? ?HPI ?Glenda Adams is a 26 y.o. female presenting for feeling increasingly more ill over the past 3 days or so.  Patient reports that she was sick 2 weeks ago and felt better last week but did not fully recover and test week or symptoms worsen especially cough and sputum became yellowish-green with streaks of blood.  Also reports discolored nasal drainage and left-sided ear pain.  Reports sinus pressure and headaches as well as chest tightness.  Denies any shortness of breath or fever.  No sick contacts or known exposure to COVID-19 reported.  Has been taking over-the-counter Mucinex and also tried other cough medication and nasal spray.  Reports no improvement in symptoms in the last few days, just any worsening.  History of asthma and allergies.  No other complaints. ? ?HPI ? ?Past Medical History:  ?Diagnosis Date  ? Asthma   ? Eczema   ? Urticaria   ? ? ?Patient Active Problem List  ? Diagnosis Date Noted  ? Other allergic rhinitis 03/06/2019  ? Mild intermittent asthma without complication 02/14/2019  ? Mold exposure 02/14/2019  ? Adverse food reaction 02/14/2019  ? Anal fissure 06/01/2016  ? Rectal bleeding 06/01/2016  ? ? ?Past Surgical History:  ?Procedure Laterality Date  ? ARTHROSCOPIC REPAIR ACL Left 2017  ? TYMPANOSTOMY TUBE PLACEMENT    ? ? ?OB History   ? ? Gravida  ?1  ? Para  ?   ? Term  ?   ? Preterm  ?   ? AB  ?   ? Living  ?   ?  ? ? SAB  ?   ? IAB  ?   ? Ectopic  ?   ? Multiple  ?   ? Live Births  ?   ?   ?  ?  ? ? ? ?Home Medications   ? ?Prior to Admission medications   ?Medication Sig Start Date End Date Taking? Authorizing Provider  ?amoxicillin-clavulanate (AUGMENTIN) 875-125 MG tablet Take 1 tablet by mouth every 12 (twelve) hours for 7 days. 10/22/21 10/29/21 Yes Eusebio Friendly B, PA-C  ?fluconazole  (DIFLUCAN) 150 MG tablet Take 1 tab PO q72h prn yeast infection 10/22/21  Yes Shirlee Latch, PA-C  ?fluticasone (FLONASE) 50 MCG/ACT nasal spray Place into the nose.   Yes [provider]  ?ipratropium (ATROVENT) 0.06 % nasal spray Place 2 sprays into both nostrils 4 (four) times daily. 10/22/21  Yes Shirlee Latch, PA-C  ?LO LOESTRIN FE 1 MG-10 MCG / 10 MCG tablet Take 1 tablet by mouth daily. 07/16/21  Yes [provider]  ?montelukast (SINGULAIR) 10 MG tablet Take 1 tablet (10 mg total) by mouth at bedtime. 03/06/19  Yes Ellamae Sia, DO  ?albuterol (VENTOLIN HFA) 108 (90 Base) MCG/ACT inhaler Inhale 2 puffs into the lungs every 6 (six) hours as needed for wheezing or shortness of breath.    [provider]  ?Ascorbic Acid (VITAMIN C) 1000 MG tablet Take 1,000 mg by mouth daily.    [provider]  ?cetirizine (ZYRTEC) 10 MG tablet Take 10 mg by mouth daily.    [provider]  ?cetirizine (ZYRTEC) 5 MG tablet Take by mouth.    [provider]  ?Lucila Maine  PO Take by mouth.    [provider]  ?etonogestrel-ethinyl estradiol (NUVARING) 0.12-0.015 MG/24HR vaginal ring Place 1 each vaginally every 28 (twenty-eight) days. Insert vaginally and leave in place for 3 consecutive weeks, then remove for 1 week.    [provider]  ?ibuprofen (ADVIL) 600 MG tablet Take 1 tablet (600 mg total) by mouth every 8 (eight) hours as needed. 11/03/19   Wallis BambergMani, Mario, PA-C  ?NON FORMULARY [employer]    [provider]  ?phenylephrine (SUDAFED PE) 10 MG TABS tablet Take 10 mg by mouth every 4 (four) hours as needed.    [provider]  ?promethazine-dextromethorphan (PROMETHAZINE-DM) 6.25-15 MG/5ML syrup Take 5 mLs by mouth 4 (four) times daily as needed for cough. 11/14/20   Particia NearingLane, Rachel Elizabeth, PA-C  ?Burr MedicoXULANE 150-35 MCG/24HR transdermal patch PLEASE SEE ATTACHED FOR DETAILED DIRECTIONS 09/14/19   [provider]  ? ? ?Family  History ?Family History  ?Problem Relation Age of Onset  ? Allergic rhinitis Mother   ? Heart murmur Mother   ? Asthma Sister   ? ? ?Social History ?Social History  ? ?Tobacco Use  ? Smoking status: Never  ? Smokeless tobacco: Never  ?Vaping Use  ? Vaping Use: Never used  ?Substance Use Topics  ? Alcohol use: Yes  ? Drug use: Yes  ?  Types: Marijuana  ? ? ? ?Allergies   ?Patient has no known allergies. ? ? ?Review of Systems ?Review of Systems  ?Constitutional:  Positive for fatigue. Negative for chills, diaphoresis and fever.  ?HENT:  Positive for congestion, ear pain, rhinorrhea and sinus pressure. Negative for sore throat.   ?Respiratory:  Positive for cough and chest tightness. Negative for shortness of breath.   ?Cardiovascular:  Negative for chest pain.  ?Gastrointestinal:  Negative for abdominal pain, nausea and vomiting.  ?Musculoskeletal:  Negative for arthralgias and myalgias.  ?Skin:  Negative for rash.  ?Neurological:  Negative for weakness and headaches.  ?Hematological:  Negative for adenopathy.  ? ? ?Physical Exam ?Triage Vital Signs ?ED Triage Vitals  ?Enc Vitals Group  ?   BP 10/22/21 1514 115/75  ?   Pulse Rate 10/22/21 1514 85  ?   Resp 10/22/21 1514 14  ?   Temp 10/22/21 1514 98.5 ?F (36.9 ?C)  ?   Temp Source 10/22/21 1514 Oral  ?   SpO2 10/22/21 1514 100 %  ?   Weight 10/22/21 1510 103 lb (46.7 kg)  ?   Height 10/22/21 1510 5' (1.524 m)  ?   Head Circumference --   ?   Peak Flow --   ?   Pain Score 10/22/21 1509 5  ?   Pain Loc --   ?   Pain Edu? --   ?   Excl. in GC? --   ? ?No data found. ? ?Updated Vital Signs ?BP 115/75 (BP Location: Right Arm)   Pulse 85   Temp 98.5 ?F (36.9 ?C) (Oral)   Resp 14   Ht 5' (1.524 m)   Wt 103 lb (46.7 kg)   LMP 09/09/2021 (Approximate)   SpO2 100%   BMI 20.12 kg/m?  ?   ? ?Physical Exam ?Vitals and nursing note reviewed.  ?Constitutional:   ?   General: She is not in acute distress. ?   Appearance: Normal appearance. She is ill-appearing. She is not  toxic-appearing.  ?HENT:  ?   Head: Normocephalic and atraumatic.  ?   Right Ear: A middle  ear effusion is present.  ?   Left Ear: A middle ear effusion is present. Tympanic membrane is injected, erythematous and bulging.  ?   Nose: Congestion present.  ?   Mouth/Throat:  ?   Mouth: Mucous membranes are moist.  ?   Pharynx: Oropharynx is clear.  ?Eyes:  ?   General: No scleral icterus.    ?   Right eye: No discharge.     ?   Left eye: No discharge.  ?   Conjunctiva/sclera: Conjunctivae normal.  ?Cardiovascular:  ?   Rate and Rhythm: Normal rate and regular rhythm.  ?   Heart sounds: Normal heart sounds.  ?Pulmonary:  ?   Effort: Pulmonary effort is normal. No respiratory distress.  ?   Breath sounds: Normal breath sounds.  ?Musculoskeletal:  ?   Cervical back: Neck supple.  ?Skin: ?   General: Skin is dry.  ?Neurological:  ?   General: No focal deficit present.  ?   Mental Status: She is alert. Mental status is at baseline.  ?   Motor: No weakness.  ?   Gait: Gait normal.  ?Psychiatric:     ?   Mood and Affect: Mood normal.     ?   Behavior: Behavior normal.     ?   Thought Content: Thought content normal.  ? ? ? ?UC Treatments / Results  ?Labs ?(all labs ordered are listed, but only abnormal results are displayed) ?Labs Reviewed - No data to display ? ?EKG ? ? ?Radiology ?No results found. ? ?Procedures ?Procedures (including critical care time) ? ?Medications Ordered in UC ?Medications - No data to display ? ?Initial Impression / Assessment and Plan / UC Course  ?I have reviewed the triage vital signs and the nursing notes. ? ?Pertinent labs & imaging results that were available during my care of the patient were reviewed by me and considered in my medical decision making (see chart for details). ? ?26 year old female presenting for feeling unwell for the past 3 weeks.  Reports she had a lot of cough and congestion 2 weeks ago which seemed to improve last week but not resolved and then had worsening of symptoms  this week with now with discolored sputum and nasal congestion which is blood-tinged.  Also reporting sinus pressure and left ear pain.  Vitals are stable.  Patient is mildly ill-appearing but nontoxic.  She does hav

## 2021-10-22 NOTE — ED Triage Notes (Signed)
Patient c/o cough, nasal congestion, left ear pain that started 3 weeks ago.  Patient denies recent fevers.  ?

## 2021-10-22 NOTE — Discharge Instructions (Signed)
?-  Symptoms consistent with a sinus infection.  I sent antibiotics to pharmacy.  Continue Mucinex.  I sent a nasal spray for you as well.  Increase rest and fluids.  Use your inhaler if you feel short of breath. ?- Follow-up for any worsening symptoms. ?

## 2021-11-15 DIAGNOSIS — Z8619 Personal history of other infectious and parasitic diseases: Secondary | ICD-10-CM | POA: Diagnosis not present

## 2021-11-15 DIAGNOSIS — N898 Other specified noninflammatory disorders of vagina: Secondary | ICD-10-CM | POA: Diagnosis not present

## 2022-03-17 DIAGNOSIS — Z01419 Encounter for gynecological examination (general) (routine) without abnormal findings: Secondary | ICD-10-CM | POA: Diagnosis not present

## 2022-03-17 DIAGNOSIS — Z3041 Encounter for surveillance of contraceptive pills: Secondary | ICD-10-CM | POA: Diagnosis not present

## 2022-03-17 DIAGNOSIS — N898 Other specified noninflammatory disorders of vagina: Secondary | ICD-10-CM | POA: Diagnosis not present

## 2022-03-17 DIAGNOSIS — Z113 Encounter for screening for infections with a predominantly sexual mode of transmission: Secondary | ICD-10-CM | POA: Diagnosis not present

## 2022-06-13 DIAGNOSIS — N766 Ulceration of vulva: Secondary | ICD-10-CM | POA: Diagnosis not present

## 2022-06-13 DIAGNOSIS — N898 Other specified noninflammatory disorders of vagina: Secondary | ICD-10-CM | POA: Diagnosis not present

## 2022-08-25 DIAGNOSIS — B3731 Acute candidiasis of vulva and vagina: Secondary | ICD-10-CM | POA: Diagnosis not present

## 2022-08-25 DIAGNOSIS — N910 Primary amenorrhea: Secondary | ICD-10-CM | POA: Diagnosis not present

## 2022-09-01 ENCOUNTER — Telehealth: Payer: Self-pay | Admitting: Family Medicine

## 2022-09-01 LAB — PRENATAL: hCG Value: 77880

## 2022-09-01 NOTE — Telephone Encounter (Signed)
Called patient to schedule new ob appointments, there was no answer to the phone call so a voicemail was left with the call back number for the office.

## 2022-10-11 ENCOUNTER — Telehealth (INDEPENDENT_AMBULATORY_CARE_PROVIDER_SITE_OTHER): Payer: Federal, State, Local not specified - PPO

## 2022-10-11 DIAGNOSIS — O0990 Supervision of high risk pregnancy, unspecified, unspecified trimester: Secondary | ICD-10-CM | POA: Insufficient documentation

## 2022-10-11 DIAGNOSIS — Z3689 Encounter for other specified antenatal screening: Secondary | ICD-10-CM

## 2022-10-11 DIAGNOSIS — B009 Herpesviral infection, unspecified: Secondary | ICD-10-CM | POA: Insufficient documentation

## 2022-10-11 DIAGNOSIS — Z3480 Encounter for supervision of other normal pregnancy, unspecified trimester: Secondary | ICD-10-CM

## 2022-10-11 DIAGNOSIS — Z3490 Encounter for supervision of normal pregnancy, unspecified, unspecified trimester: Secondary | ICD-10-CM | POA: Insufficient documentation

## 2022-10-11 NOTE — Progress Notes (Signed)
New OB Intake  I connected with Glenda Adams  on 10/11/22 at  3:15 PM EDT by MyChart Video Visit and verified that I am speaking with the correct person using two identifiers. Nurse is located at [facility] and pt is located at home.  I discussed the limitations, risks, security and privacy concerns of performing an evaluation and management service by telephone and the availability of in person appointments. I also discussed with the patient that there may be a patient responsible charge related to this service. The patient expressed understanding and agreed to proceed.  I explained I am completing New OB Intake today. We discussed EDD of 04/08/23 that is based on LMP of 07/02/22. Pt is G1/P0. I reviewed her allergies, medications, Medical/Surgical/OB history, and appropriate screenings. I informed her of [facility] services. [facility] information placed in AVS. Based on history, this is a low risk pregnancy.  Patient Active Problem List   Diagnosis Date Noted   HSV infection 10/11/2022   Supervision of other normal pregnancy, antepartum 10/11/2022   Mild intermittent asthma without complication [id number]    Concerns addressed today  Delivery Plans Plans to deliver at [facility]. Patient given information for [facility] Healthy Baby website for more information about Women's and [place]. Patient is not interested in water birth. Offered upcoming OB visit with CNM to discuss further.  MyChart/Babyscripts MyChart access verified. I explained pt will have some visits in office and some virtually. Babyscripts instructions given and order placed. Patient verifies receipt of registration text/e-mail. Account successfully created and app downloaded.  Blood Pressure Cuff/Weight Scale Patient has private insurance; instructed to purchase blood pressure cuff and bring to first prenatal appt. Explained after first prenatal appt pt will check weekly and document in 42. Patient does have weight  scale.  Anatomy US Explained first scheduled [place] will be around 19 weeks. Anatomy US scheduled for 11/18/22 at 0900a. Pt notified to arrive at 0845a.  Labs Discussed Johnsie Cancel genetic screening with patient. Would like both Panorama and Horizon drawn at new OB visit. Routine prenatal labs needed.  COVID Vaccine Patient has had COVID vaccine.   Is patient a CenteringPregnancy candidate?  Accepted       Is patient a Mom+Baby Combined Care candidate?  Not a candidate   Social Determinants of Health Food Insecurity: Patient denies food insecurity. WIC Referral: Patient is interested in referral to [facility].  Transportation: Patient denies transportation needs. Childcare: Discussed no children allowed at ultrasound appointments. Offered childcare services; patient declines childcare services at this time.  Interested in [place]? If yes, send referral and doula dot phrase.   First visit review I reviewed new OB appt with patient. Explained pt will be seen by Gaylan Gerold, CNM at first visit; encounter routed to appropriate provider. Explained that patient will be seen by pregnancy navigator following visit with provider.   Bethanne Ginger, [place] 10/11/2022  3:49 PM

## 2022-10-11 NOTE — Patient Instructions (Signed)
Safe Medications in Pregnancy   Acne:  Benzoyl Peroxide  Salicylic Acid   Backache/Headache:  Tylenol: 2 regular strength every 4 hours OR               2 Extra strength every 6 hours   Colds/Coughs/Allergies:  Benadryl (alcohol free) 25 mg every 6 hours as needed  Breath right strips  Claritin  Cepacol throat lozenges  Chloraseptic throat spray  Cold-Eeze- up to three times per day  Cough drops, alcohol free  Flonase (by prescription only)  Guaifenesin  Mucinex  Robitussin DM (plain only, alcohol free)  Saline nasal spray/drops  Sudafed (pseudoephedrine) & Actifed * use only after [redacted] weeks gestation and if you do not have high blood pressure  Tylenol  Vicks Vaporub  Zinc lozenges  Zyrtec   Constipation:  Colace  Ducolax suppositories  Fleet enema  Glycerin suppositories  Metamucil  Milk of magnesia  Miralax  Senokot  Smooth move tea   Diarrhea:  Kaopectate  Imodium A-D   *NO pepto Bismol   Hemorrhoids:  Anusol  Anusol HC  Preparation H  Tucks   Indigestion:  Tums  Maalox  Mylanta  Zantac  Pepcid   Insomnia:  Benadryl (alcohol free) 25mg every 6 hours as needed  Tylenol PM  Unisom, no Gelcaps   Leg Cramps:  Tums  MagGel   Nausea/Vomiting:  Bonine  Dramamine  Emetrol  Ginger extract  Sea bands  Meclizine  Nausea medication to take during pregnancy:  Unisom (doxylamine succinate 25 mg tablets) Take one tablet daily at bedtime. If symptoms are not adequately controlled, the dose can be increased to a maximum recommended dose of two tablets daily (1/2 tablet in the morning, 1/2 tablet mid-afternoon and one at bedtime).  Vitamin B6 100mg tablets. Take one tablet twice a day (up to 200 mg per day).   Skin Rashes:  Aveeno products  Benadryl cream or 25mg every 6 hours as needed  Calamine Lotion  1% cortisone cream   Yeast infection:  Gyne-lotrimin 7  Monistat 7    **If taking multiple medications, please check labels to avoid  duplicating the same active ingredients  **take medication as directed on the label  ** Do not exceed 4000 mg of tylenol in 24 hours  **Do not take medications that contain aspirin or ibuprofen            [place] Pediatric Providers  [place] ([address]) [facility] Brown, MD; Chambliss, MD; Eniola, MD; Hensel, MD; McDiarmid, MD; McIntyer, MD [address] [place], [place] [address] [phone] Mon-Fri 8:30-12:30, 1:30-5:00  Providers come to see babies during newborn hospitalization Only accepting infants of Mother's who are seen at Family Medicine Center or have siblings seen at   Family [facility] Center Medicaid - Yes; Tricare - Yes   [facility] Mulberry, MD [address] [place], [place] [address] [phone] Mon, Tue, Thur, Fri 8:30-5:00, Wed 10:00-7:00 (closed 1-2pm daily for lunch) Takes [place] residents with no insurance.  [place] only with Medicaid/insurance; Tricare - no  [facility] Ben-Davies, MD; Brown, MD; Chandler, MD; Ettefagh, MD; Grant, MD; Hanvey, MD; Herrin, MD; Jones,  MD; Lester, MD; McCormick, MD; McQueen, MD; Simha, MD; Stanley, MD; Stryffeler, NP [address], [place], [place] [address] [phone] Mon, Tue, Thur, Fri 8:30-5:30, Wed 9:30-5:30, Sat 8:30-12:30 Only accepting infants of first-time parents or siblings of current   patients Hospital discharge coordinator will make follow-up appointment Medicaid - yes; Tricare - yes  [place] ([address]) [facility] Cox, MD; Davis, MD; Dovico, MD; Ettefaugh, MD; Lowe, MD; Nation, MD; Slimp, MD; Sumner, MD; Williams, MD [address], [place], [place] [address] [phone] Mon-Fri 8:30-5:00, closed for lunch 12:30-1:30; Sat-Sun 10:00-1:00 Accepting Newborns with commercial insurance only, must call prior to  delivery to be accepted into  practice.  Medicaid - no, Tricare - yes   [facility] [address] [place], [place] [address] [phone] or [phone] Mon to Fri 8am to 10pm, Sat 8am to 1pm (virtual only on weekends) Only accepts Medicaid Healthy Blue pts  [facility]  Artis, MD; Coccaro, MD; Lock[place] Gardner, MD; Netherton, NP; Roper, MD; Wilmot, PA-C; Skinner, MD [address] [place], [place] [address] [phone] Mon-Fri 8:30-5:30 Medicaid - yes, Tricare - yes  [place] ([address]) [facility] Warner, MD [address], [place], [place] [address] [phone] Mon, Tues, Wed Fri 8:30-5:00, Sat 8:30-12:00, Closed Thursdays Accepting siblings of established patients and first time mom's if you call prenatally Medicaid- yes; Tricare - yes  [facility] Becker, PA; Hagler, MD; Quinn, PA-C; Scifres, PA; Sun, MD; Swayne, MD;  [address], [place], [place] [address] [phone] Mon-Fri 8:30-5:00, closed for lunch 1-2 Only accepting newborns of established patients Medicaid- no; Tricare - yes  [place] ([address]) [facility] Timberlake, MD; [address], [place], [place] [address] [phone] Mon-Fri 8:00-5:00 Medicaid - No; Tricare - Yes  [facility] [employer]  Brake, NP; Wharton, PA [address], [place], [place] [address] [phone] Mon-Fri 8:00-5:00 Medicaid - No, Tricare - Yes  [facility] Gay, MD; Quinlan, MD; Blatt, DNP [address], [place] [address] [phone]  Mon-Fri 8:00-5:00 Medicaid - No; Tricare - Yes  [facility] [address] [place], [place] [address] [phone] Mon-Fri 8:30-5:00 (lunch 12:00-1:00) Medicaid -Yes; Tricare - Yes  [facility] Jordan, MD [address],  [place], [place] [address] [phone] Mon-Fri 8:00-5:00 Seeing newborns of current patients only. No new patients Medicaid - No, Tricare - yes  [facility] Parker, MD [address] [place], [place] [address] [phone] Mon-Fri 8:00-5:00 Medicaid -yes as secondary coverage only; Tricare - yes  [facility] Brecken, PA; Christy, NP; Dees, MD; DeClaire, MD; DeWeese, MD; Hodge, PA; Smoot, NP; Summer, MD; Vapne, MD [address] [place], [place] [address] [phone] Mon-Fri 8:30-5:00, Sat 9:00-11:00 Accepts commercial insurance ONLY. Offers free prenatal information sessions for families. Medicaid - No, Tricare - Call first  [facility] Bouska, MD; Gordon, PA; Jeffery, PA; Weber, PA [address] [place] [address] [phone] Mon-Fri 7:30-5:30 Medicaid - Yes; Tricare - yes  [place] ([address] & [address])  [facility] Reese, MD [address] [place], [place] [address] [phone] Mon-Thur 8:00-6:00, closed for lunch 12-2, closed Fridays Medicaid - yes; Tricare - no  [facility] Anderson, NP; Badger, MD; Beal, PA; Spencer, PA [address], [place], [place] [address] [phone] Mon-Fri 7:30-4:30 Medicaid - yes, Tricare - yes  [facility]  Agbuya, MD; Klett, NP; Romgoolam, MD; Rothstein, NP [address], ,  [address] [phone] Mon-Fri 8:30-5:00, closed for lunch 1-2, Sat 8:30-12:00 - sick visits only Providers come to see   babies at [facility] Only accepting newborns of siblings and first time parents ONLY if who have met with office prior to delivery Medicaid -Yes; Tricare - yes  [facility] [place]  Golden, DO; Friddle, NP; Wallace, MD; Wood, MD:  [address], [place], [place] [address] [phone] Mon- Fri 8:00-5:00, Sat 9:00-12:00 - sick  visits only Accepting siblings of established patients and first time mom/baby Medicaid - Yes; Tricare - yes Patients must have vaccinations (baby vaccines)  [place] ([address])  [facility] [address] [place], [place] [address] [phone] Mon-Fri 8:00-5:00 Medicaid - no; Tricare - yes  [facility] Briscoe, MD; Schmidt, PA; Moreira, PA [address], [place], [place] [address] [phone] Mon-Fri 8:00-5:00 Medicaid- yes; Tricare - yes  [facility] [place] Boyd, MD; Jones, NP; Osborn, PA [address], [place], [place] [address] [phone] Mon-Fri 8:00-5:00 Medicaid - Yes; Tricare - yes  [place] ([address])  [facility] Atkinson, PA; Calderon, PA; Cummings, MD; Dillard, MD; Henrish, NP; Isenhour, DO; Martin, PA; Olson, MD; Ott, MD; Phillips, MD; Valente, PA; VanDeven, PA; Yonjof, NP [address], [place], [place] [address] [phone] Mon-Fri 8:30-5:00, Sat 9:00-12:00 - sick only Please register online [email or web address] then schedule online or call office Medicaid-Yes; Tricare -yes  [facility]  Dabrusco, MD; Dial, MD; [place], MD; Fleenor, NP; Goolsby, PA; Tonuzi, MD; Turner, NP; West, MD [address], [place], [place] [address] [phone] Mon-Fri 8:00-5:30, Sat&Sun by appointment (phones open at 8:30) Medicaid - Yes; Tricare - yes  [place] ([address]) [facility] Allen, CPNP; Bates, MD; Gordon, MD; Mills, NP; Weinshilboum, DO [address], [place], [place] [address] [phone] M-F 8:00 - 5:15, Sat/Sun 9-12 sick visits only Medicaid - No; Tricare - yes  [facility] [place] [facility]  Brown, PA-C; Cowen, PA-C; Dennis, DO; Fuster, PA-C; Martin, PA-C; Shelton,  PA-C; Spry, MD [address] [place], [place] [address] [phone] Mon-Thur 8:00-7:00, Fri 8:00-5:00 Accepting Medicaid for 13 and under only   [facility] [place] [facility] [place] Hayes, FNP; List, FNP; Moran, MD; Pitonzo, PA-C; Scholer, MD; Spangle, FNP; Nzenwa, FNP; Jasper, MD; Moran, MD [address] [place], [place] [address] [phone] Mon-Fri 8:30-5:30 Medicaid - Yes; Tricare - yes  [facility] [place]  Kelly, CPNP; Logan, MD; Poth, MD; Ramadoss, MD; Staton, NP [address], [place], [place] [address] [phone] Mon-Thur 8:00-5:30, Fri 8:00-5:00, Sat 9:00-12:00 Medicaid - yes, Tricare - yes  [place] ([address])  [facility] [place] Masneri, DO; Meyers, MD; Nelson, PA [address], [place], [place] [address] [phone] Mon-Fri 8:00-5:00, closed for lunch 12-1 Medicaid - No; Tricare - yes  [facility] [place] McGowen, MD [address], [place], [place] [address] [phone] Mon-Fri 8:00-5:00 Medicaid - No; Tricare - yes  [facility] [place] MacDonald, MD; Nayak, MD; Kearns, MD; Jones, MD [address] [place] [address], [place], [place] [address] [phone] Mon-Fri 8:00-5:00 Medicaid- Yes; Tricare - yes  [place] ([address])   [facility] [place] [facility] Martin, PA-C; Tabori, MD [address], [place], [place] [address] [phone] Mon-Fri 8:00-5:00 Medicaid - No; Tricare - yes  [facility] [place]  Margin - CPNP [address], [place], [place] [address] [phone] Mon-Weds 8:00-6:00, Thurs-Fri 8:00-5:00, Sat 9:00-12:00 Medicaid - yes; Tricare - yes   [facility] [place] Aubuchon, MD; Brandon, PA [address] [place], [place] [address] [phone] Mon-Fri 8:00-5:00 Medicaid - yes; Tricare - yes  [place]  Pediatric Providers  [facility] [address], [place], [place] [address] [phone] M, Thur: 8am -8pm, Tues, Weds: 8am - 5pm; Fri: 8-1 Medicaid - Yes; Tricare - yes  [facility] Mertz, MD; Johnson, MD; Wells, MD; Downs, PA; Hockenberger, PA [address], [place], [place] [address] [phone] M-F 8:30 - 5:00 Medicaid - Call office; Tricare -yes  [facility] Bonney, MD; Page, MD, Minter, MD; Mueller, PNP; Thomason, NP [address], [place], [place] [address] [phone] M-F 8:30 - 5:00, Sat/Sun 8:30 - 12:30 (sick visits) Medicaid - Call office; Tricare -yes  [facility] Lewis, MD; Shaub, PNP; Boylston, MD; Quaile, PA; Nonato, NP; Landon, CPNP [address], [place], [place] [address] [phone] M-F 8:30 - 5:00 Medicaid - Call office; Tricare - yes  [facility] [place] Cline, MD; Dvergsten, MD; Flores, MD; Kawatu, MD; Nogo, MD [address], [place], [place] [address] [phone] M-Thur: 8:00 - 5:00; Fri: 8:00 - 4:00 Medicaid - yes; Tricare - yes  [facility] [address] [place], [place], [place] [address] [phone] M-F: 8:30- 5:00, closed for lunch 12:30 - 1:00 Medicaid - yes; Tricare -yes  [facility] [place] [address], [place], [place] [address] [phone] M-F 8:00 - 5:00 Medicaid - yes; Tricare - yes  [facility] [place] [facility] Johnson, DO; Rumball, DO; Wicker, NP [address] [place], [place], [place] [address] [phone] M-F 8:00 - 5:00, Closed 12-1 for lunch Medicaid - Call; Tricare - yes  [facility] Stein, MD [address], [place], [place] [address] [phone] M-F: 8:00-5:00, Sat: 8:00 - noon Medicaid - call; Tricare -yes  [place] Pediatric Providers  [facility] Collins, FNP-C [address], [place], [place] [address] [phone] M-W: 8:00-5:00, Thur: 8:00 -  7:00, Fri: 8:00 - noon Medicaid - yes; Tricare - yes  [facility] [place] Adams, FNP [address], [place], [place] [address] [phone] M-F 8:00 - 5:00, Closed for lunch 12-1 Medicaid - yes; Tricare - yes  [place] Pediatric Providers  [facility] Smith, FNP, Melvin, MD, Fay, FNP-C [address] [place] [address] [place], [place], [place] [address] [phone] M-T 8:00-5:00, Wed-Fri 7:00-6:00 Medicaid - Yes; Tricare -yes  [facility] [place] Civiletti, DO; [address], [place], [place] [address] [phone] M-F 8:00 - 5:00, closed for lunch 12-1 Medicaid - Yes; Tricare - yes  [facility]  Barnes, MD; Bergdolt, MD; Caulfield, MD; Emrich, MD; Fiscus, MD; Hoppens, MD; Kylstra, MD, McPherson, MD; Todd, MD; Prestwood, MD; Waters, MD; Wood, MD [address], [place], [place] [address] [phone] M-F 8:00-5:00 Medicaid - yes; Tricare - yes  [facility] Cheema, MD (speaks Punjabi and Hindi) [address] [place], [place] [address] [phone] M-F: 8:30 - 5:00, closed 12:30 - 1 for lunch Medicaid - Yes; Tricare -yes  [place] Pediatric Providers  [facility] Loda, MD; Timberlake, MD; Burke,   MD [address], [place], [place] [address] [phone] M-Th: 8:00 - 5:30, Fri: 8:00 - 12:00 Medicaid - yes; Tricare - yes  [facility] [place] Lookabill, NP; Meier, MD; Daffron, MD [address], [place], [place] [address] [phone] M-F: 8:00 - 5:00 Medicaid - yes; Tricare - yes  [facility] Busse, NP; Bowman, NP; Baune, NP; Entwistle, MD; Williams, MD, Huffman, NP, Ferguson, MD; Patel, DO [address], [place], [place] [address] [phone] M-F: 8:30 - 5:30p Medicaid - yes; Tricare - yes Other locations available as well  [place] [facility] Rajan, MD; Wilson, MD; Morgan, PA-C, Domenech, PA-C; Myers, PA-C [address], [place], [place] [address] [phone] M-W: 8:00am - 7:00pm, Thurs: 8:00am - 8:00pm; Fri: 8:00am - 5:00pm, closed daily from 12-1 for lunch Medicaid - yes; Tricare - yes  [place] Pediatric Providers  [facility] [place] Adams, MD; Crystal, FNP; Hadley, MD; Stokes, MD; Johnson, PNP; Brady, PA-C; West, PNP; Gardner, MD;  [address], [place], [place] [address] [phone] M - Fri: 8am - 5pm, Sat 9-noon Medicaid - Yes; Tricare -yes  [facility] [place] Nayak, MD; Jones, FNP; McDonald, MD; Kearns, MD [address] [place] [address], [place], [place] [phone] M-F 8:00 - 5:00 Medicaid - call; Tricare - yes  [facility] [place] Bell, MD; Emory, PNP; Pinder, MD; Anderson, MD; Light, PA-C; Johnson, MD; Latta, MD; Saul, PNP; Rainey, MD; Clifford, MD; McClung, MD [address], [place], [place] [address] [phone] M-F 8:00am - 5:00pm; Sat. 9:00 - 11:00 Medicaid - yes; Tricare - yes  [facility] [place] Soldato-Couture, MD [address], [place], [place] [address] [phone] M-F 8:00 - 5:00 Medicaid - [place] Medicaid only; Tricare - yes  [facility] [place] Walker, MD; Davis, PNP; Ajizian, MD [address] [place] [address], [place], [place] [address] [phone] M-F 8:00 - 5:00 Medicaid - yes; Tricare - yes  [facility] [place] Barry, MD; Brown, MD, [place], MD, Hazek, MD; Hoyle, MD; Smith, MD; [address] [place], [place], [place] [address] [phone] M-F: 8-5 Medicaid - yes; Tricare - yes  [facility] [place] Brady, Md; Dowlen, MD; [address] [place] [address], [place], [place] [address] [phone] M-F 8-5 Medicaid - yes; Tricare - yes  [facility] Kearns, MD; Nayak, MD; Soldato-Courture, MD; Pellam-Palmer, DNP;  Herring, PNP [address], [place], [place] [address] [phone] M-F 8-5 Medicaid - yes; Tricare - yes  [facility] Weathers, MD; Merritt, PA-C; Davis-PA-C; Warnimont, MD [address], [place], [place] [address] [phone] M-F 7am - 5 pm Medicaid - call; Tricare - yes  [facility] Hill, PNP; Erickson, MD; Robinson, MD [address], [place], [place] [address] [phone] M-F 8-5 Medicaid - yes; Tricare - yes  [facility] Kribbs, MD; Warner, MD; Williams, FNP; Brooks, FNP; Boles, FNP; Romblad, PA-C; [place] - FNP [address], [place], [place] [address] [phone] M-F 8-5 Medicaid- yes; Tricare - yes  [facility] [place] [place]oder, MD; Verenes, MD; Armentrout, MD; Stewart, MD; Beasley, CPNP; Ford, MD; Erickson, MD; Rice, MD [address], [place], [place] [address] [phone] M-F: 8-5, Sat: 9-4, Sun 9-12 Medicaid - yes; Tricare - yes  [facility] Little, PNP; Davis, PNP [address], [place],   [place] [address] [phone] M-F 8 - 5, closed 12-1 for lunch Medicaid - yes; Tricare - yes  [facility] Friesen, MD; Cnegia, MD; Rice, MD; Patel, DO [address], [place], [place] [address] [phone] M-F 8- 5:30 Medicaid - yes; Tricare - yes  [facility] [place] Zvolensky, MD; Ray, MD; Haas, MD [address], [place], [place] [address] [phone] M: 8-7; Tues-Fri: 8-5; Sat: 9-12 Medicaid - yes; Tricare - yes  [facility] [place] Heinrich, MD; Meyer, MD; Clark, MD; Rhyne, MD; Aubuchon, MD [address], [place], [place] [address] [phone] M: 8-7; Tues-Fri: 8-5; Sat: 8:30-12:30 Medicaid - yes;  Tricare - yes  [facility] [place] Bista, MD; Dillard, PA [address], [place], [place] [address] [phone] Mon-Fri: 8-5 Medicaid - yes; Tricare - yes  [facility] [place] Beasley, CPNP; Mahle, CPNP; Rice, MD; Duffy, MD; Culler, MD; [address], [place], [place] [address] [phone] M-F: 8-5, closed 1-2 for lunch Medicaid - yes; Tricare - yes  [facility] Rickman, PA; Mounce, NP; Smith, MD; Jordan, CPNP; Darty, PA; Ball, MD; Wallace, MD [address] [place] [address], , [place] [address] [phone] M-Thurs: 8-7; Fri: 8-6; Sat: 9-12; Sun 2-4 Medicaid - yes; Tricare - yes  [facility] Brown, MD; Shin, MD; Goodman, DNP, FNP; Sebesta, DO; [address], [place], [place] [address] [phone] M-F: 8-5 Medicaid - yes; Tricare - yes  [place] [facility] Providers  [facility] Dough, MD; Welsh, NP [address], [place], [place] [address] [phone] M - Fri: 8am - 5pm, closed for lunch 12-1 Medicaid - Yes; Tricare - yes  [facility] Manandhar, MD; Riley, MD; Sanger, DO; Vinocur, MD;Hall, PA; Walsh, PA; Campbell, NP [address], [place] [address] [phone] M-F 8:00 - 5:00, Sat 8:00 - 11:30 Medicaid - yes; Tricare - yes  [facility] Khan, MD; Redding, MD, Street, MD, Holt, MD, Burgart, MD; Rhyne, NP; Dickinson, PA;  [address] [place] [address], [place], [place] [address] [phone] M-F 8:10am - 5:00pm Medicaid - yes; Tricare - yes  [facility] Connors, MD; Kime, NP [address], [place], [place] [address] [phone] M-F 8:00 - 5:00 Medicaid - [place] Medicaid only; Tricare - yes  [facility] [place] Whyte, MD; Fox, NP [address], , [place] [address] [phone] M-F 8:00 - 5:00; Closed for lunch 12 - 1:00 Medicaid - yes; Tricare - yes  [facility] Penner, MD; Wilburn, FNP [address], , [place] [address] [phone] Mon 9-5; Tues/Wed 10-5; Thurs 8:30-5; Fri: 8-12:30 Medicaid - yes; Tricare - yes  [place] Pediatric Providers  [facility]  Golding, MD; Jackson, PA-C [address], [place],  [address] [phone] phone [phone] fax M-F 7:15 - 4:30 Medicaid - yes; Tricare - yes  [facility] Gosrani, MD; Meccariello, DO [address] [place],  [address] [phone] M-Fri: 8:30 - 5:00, closed for lunch everyday noon - 1pm Medicaid - Yes; Tricare - yes  [facility] Burdine, MD; Daniel, MD; Howard, MD; Sasser, MD; Boles, PA; Boyd, PA-C; Carroll, PA; McGee, PA; Skillman,   PA; Wilson, PA [address] [place], [place] [address] [phone] M-Thurs: 7:30am - 7:00pm; Friday 7:30am - 4pm; Sat: 8:00 - 1:00 Medicaid - Yes; Tricare - yes  [facility] [place] Akhbari, MD; Law, MD; Qayumi, MD; Salvador, DO [address], [place], [place] [address] [phone] M-Thur: 8:00 - 5:00, Fri: 8:00 - Noon Medicaid - yes; Tricare - yes No [place] [employer]  [facility] [place] [facility] Dettinger, MD; Gottschalk, DO; Hawks, NP; Martin, NP; Morgan, NP; Milian, NP; Rakes, NP; Stacks, MD; Webster, PA [address], [place], [place] [address] [phone] M-F 8:00 - 5:00 Medicaid - yes; Tricare - yes  [facility] Collins, FNP-C; Bucio, FNP-C [address], [place], [place] [address] [phone] M, W, R 8:00-5:00, Tues: 8:00am - 7:00pm; Fri 8:00 - noon Medicaid - Yes; Tricare - yes  [facility] Khan, MD [address] [place]  [address] [place], [place] [address] [phone]  M-Thurs 8:30-5:30, Fri: 8:30-12:30pm Medicaid - Yes; Tricare - N  

## 2022-10-12 ENCOUNTER — Encounter: Payer: Self-pay | Admitting: Obstetrics & Gynecology

## 2022-10-12 ENCOUNTER — Other Ambulatory Visit: Payer: Self-pay

## 2022-10-12 ENCOUNTER — Ambulatory Visit (INDEPENDENT_AMBULATORY_CARE_PROVIDER_SITE_OTHER): Payer: Federal, State, Local not specified - PPO | Admitting: Certified Nurse Midwife

## 2022-10-12 ENCOUNTER — Other Ambulatory Visit (HOSPITAL_COMMUNITY)
Admission: RE | Admit: 2022-10-12 | Discharge: 2022-10-12 | Disposition: A | Discharge status: Home / Self Care | Payer: Federal, State, Local not specified - PPO | Source: Ambulatory Visit | Attending: Certified Nurse Midwife | Admitting: Certified Nurse Midwife

## 2022-10-12 VITALS — BP 121/73 | HR 97 | Ht 60.0 in | Wt 110.9 lb

## 2022-10-12 DIAGNOSIS — R0981 Nasal congestion: Secondary | ICD-10-CM | POA: Diagnosis not present

## 2022-10-12 DIAGNOSIS — Z3492 Encounter for supervision of normal pregnancy, unspecified, second trimester: Secondary | ICD-10-CM

## 2022-10-12 DIAGNOSIS — Z3A14 14 weeks gestation of pregnancy: Secondary | ICD-10-CM | POA: Diagnosis not present

## 2022-10-12 DIAGNOSIS — O0932 Supervision of pregnancy with insufficient antenatal care, second trimester: Secondary | ICD-10-CM

## 2022-10-12 DIAGNOSIS — Z3480 Encounter for supervision of other normal pregnancy, unspecified trimester: Secondary | ICD-10-CM | POA: Diagnosis not present

## 2022-10-12 DIAGNOSIS — Z3A00 Weeks of gestation of pregnancy not specified: Secondary | ICD-10-CM | POA: Insufficient documentation

## 2022-10-12 MED ORDER — FLUTICASONE PROPIONATE 50 MCG/ACT NA SUSP: 2.0000 | Freq: Every day | NASAL | 11 refills | Status: DC

## 2022-10-12 NOTE — Progress Notes (Signed)
History:   Glenda Adams is a 27 y.o. G1P0 at [redacted]w[redacted]d by LMP being seen today for her first obstetrical visit.  Her obstetrical history is significant for  nothing (first pregnancy) . Patient does intend to breast feed. Pregnancy history fully reviewed.  Patient reports  lack of appetite and nasal congestion (stuffiness). Had been taking Sudafed which works, but stopped taking due to pregnancy. Congestion now causing sinus headaches. Has been taking Excedrin Migraine (with aspirin). Otherwise feeling well, although nervous that she may be carrying twins (FOB was a twin) .      HISTORY: OB History  Gravida Para Term Preterm AB Living  1 0 0 0 0 0  SAB IAB Ectopic Multiple Live Births  0 0 0 0 0    # Outcome Date GA Lbr Len/2nd Weight Sex Delivery Anes PTL Lv  1 Current             Last pap smear was done August 2021 and was normal  Past Medical History:  Diagnosis Date   Asthma    Eczema    Urticaria    Past Surgical History:  Procedure Laterality Date   ARTHROSCOPIC REPAIR ACL Left 2017   TYMPANOSTOMY TUBE PLACEMENT     Family History  Problem Relation Age of Onset   Allergic rhinitis Mother    Heart murmur Mother    Asthma Sister    Social History   Tobacco Use   Smoking status: Never   Smokeless tobacco: Never  Vaping Use   Vaping Use: Never used  Substance Use Topics   Alcohol use: Yes   Drug use: Not Currently    Types: Marijuana   No Known Allergies Current Outpatient Medications on File Prior to Visit  Medication Sig Dispense Refill   cetirizine (ZYRTEC) 10 MG tablet Take 10 mg by mouth daily.     Prenatal Vit-Fe Fumarate-FA (MULTIVITAMIN-PRENATAL) 27-0.8 MG TABS tablet Take 1 tablet by mouth daily at 12 noon.     albuterol (VENTOLIN HFA) 108 (90 Base) MCG/ACT inhaler Inhale 2 puffs into the lungs every 6 (six) hours as needed for wheezing or shortness of breath.     Ascorbic Acid (VITAMIN C) 1000 MG tablet Take 1,000 mg by mouth daily. (Patient not taking:  Reported on 10/11/2022)     ELDERBERRY PO Take by mouth. (Patient not taking: Reported on 10/11/2022)     etonogestrel-ethinyl estradiol (NUVARING) 0.12-0.015 MG/24HR vaginal ring Place 1 each vaginally every 28 (twenty-eight) days. Insert vaginally and leave in place for 3 consecutive weeks, then remove for 1 week. (Patient not taking: Reported on 10/11/2022)     fluconazole (DIFLUCAN) 150 MG tablet Take 1 tab PO q72h prn yeast infection 2 tablet 0   ibuprofen (ADVIL) 600 MG tablet Take 1 tablet (600 mg total) by mouth every 8 (eight) hours as needed. (Patient not taking: Reported on 10/11/2022) 30 tablet 0   ipratropium (ATROVENT) 0.06 % nasal spray Place 2 sprays into both nostrils 4 (four) times daily. (Patient not taking: Reported on 10/11/2022) 15 mL 0   LO LOESTRIN FE 1 MG-10 MCG / 10 MCG tablet Take 1 tablet by mouth daily. (Patient not taking: Reported on 10/11/2022)     montelukast (SINGULAIR) 10 MG tablet Take 1 tablet (10 mg total) by mouth at bedtime. (Patient not taking: Reported on 10/11/2022) 30 tablet 5   NON FORMULARY Floradix Iron & Herbs (Patient not taking: Reported on 10/11/2022)     phenylephrine (SUDAFED PE) 10 MG  TABS tablet Take 10 mg by mouth every 4 (four) hours as needed. (Patient not taking: Reported on 10/11/2022)     promethazine-dextromethorphan (PROMETHAZINE-DM) 6.25-15 MG/5ML syrup Take 5 mLs by mouth 4 (four) times daily as needed for cough. (Patient not taking: Reported on 10/12/2022) 100 mL 0   valACYclovir (VALTREX) 500 MG tablet Take 500 mg by mouth daily.     XULANE 150-35 MCG/24HR transdermal patch PLEASE SEE ATTACHED FOR DETAILED DIRECTIONS (Patient not taking: Reported on 10/11/2022)     No current facility-administered medications on file prior to visit.    Review of Systems Pertinent items noted in HPI and remainder of comprehensive ROS otherwise negative. Physical Exam:   Vitals:   10/12/22 1513  BP: 121/73  Pulse: 97  Weight: 110 lb 14.4 oz (50.3 kg)    Fetal Heart Rate (bpm): 150 Bedside Ultrasound for FHR check: Viable intrauterine pregnancy with positive cardiac activity note, single fetus noted Patient informed that the ultrasound is considered a limited obstetric ultrasound and is not intended to be a complete ultrasound exam.  Patient also informed that the ultrasound is not being completed with the intent of assessing for fetal or placental anomalies or any pelvic abnormalities.  Explained that the purpose of today's ultrasound is to assess for fetal heart rate.  Patient acknowledges the purpose of the exam and the limitations of the study.  Constitutional: Well-developed, well-nourished pregnant female in no acute distress.  HEENT: PERRLA Skin: normal color and turgor, no rash Cardiovascular: normal rate & rhythm, warm and well perfused Respiratory: normal effort, no problems with respiration noted GI: Abd soft, non-tender, non-distended gravid appropriate for gestational age MS: Extremities nontender, no edema, normal ROM Neurologic: Alert and oriented x 4.  Pelvic: exam deferred, pap up to date  Assessment:   Pregnancy: G1P0 Patient Active Problem List   Diagnosis Date Noted   HSV infection 10/11/2022   Supervision of low-risk pregnancy 10/11/2022   Mild intermittent asthma without complication [id number]   Plan:   1. Encounter for supervision of low-risk pregnancy in second trimester - Doing well overall, encouraged frequent small meals/snacks until her appetite fully returns - Panorama Prenatal Test Full Panel - CBC/D/Plt+RPR+Rh+ABO+RubIgG... - HgB A1c - Culture, OB Urine - [facility] Basic Panel - Cervicovaginal ancillary only( [place])  2. [redacted] weeks gestation of pregnancy - Routine OB care, will get AFP at next visit  3. Sinus congestion - Encouraged use of saline spray followed by flonase, as well as switching to Migraine-Tension which does not have aspirin - fluticasone (FLONASE) 50 MCG/ACT nasal spray; Place  2 sprays into both nostrils daily.  Dispense: 11.1 mL; Refill: 11  4. Initial obstetric visit in second trimester - Initial labs drawn. - Continue prenatal vitamins. - Problem list reviewed and updated. - Genetic Screening discussed, First trimester screen, Quad screen, and NIPS: ordered. - Ultrasound discussed; fetal anatomic survey: ordered. - Anticipatory guidance about prenatal visits given including labs, ultrasounds, and testing. - Discussed usage of Babyscripts and virtual visits as additional source of managing and completing prenatal visits in midst of coronavirus and pandemic.   - Encouraged to complete MyChart Registration for her ability to review results, send requests, and have questions addressed.  - The nature of [place] for [facility] with multiple MDs and Advanced Practice Providers was explained to patient; also emphasized that residents, students are part of our team. - Routine obstetric precautions reviewed. Encouraged to seek out care at office or emergency room (  [place] [facility] preferred) for urgent and/or emergent concerns.  Return in about 4 weeks (around 11/09/2022) for IN-PERSON, LOB.    Gaylan Gerold, MSN, CNM, [place] Certified Nurse Midwife, [place] [facility]

## 2022-10-13 LAB — CERVICOVAGINAL ANCILLARY ONLY
Chlamydia: NEGATIVE
Comment: NEGATIVE
Comment: NEGATIVE
Comment: NORMAL
Neisseria Gonorrhea: NEGATIVE
Trichomonas: NEGATIVE

## 2022-10-13 LAB — CBC/D/PLT+RPR+RH+ABO+RUBIGG...
Antibody Screen: NEGATIVE
Basophils Absolute: 0 10*3/uL (ref 0.0–0.2)
Basos: 0 %
EOS (ABSOLUTE): 0.1 10*3/uL (ref 0.0–0.4)
Eos: 2 %
HCV Ab: NONREACTIVE
HIV Screen 4th Generation wRfx: NONREACTIVE
Hematocrit: 37.6 % (ref 34.0–46.6)
Hemoglobin: 12.6 g/dL (ref 11.1–15.9)
Hepatitis B Surface Ag: NEGATIVE
Immature Grans (Abs): 0 10*3/uL (ref 0.0–0.1)
Immature Granulocytes: 0 %
Lymphocytes Absolute: 2 10*3/uL (ref 0.7–3.1)
Lymphs: 30 %
MCH: 31 pg (ref 26.6–33.0)
MCHC: 33.5 g/dL (ref 31.5–35.7)
MCV: 93 fL (ref 79–97)
Monocytes Absolute: 0.4 10*3/uL (ref 0.1–0.9)
Monocytes: 6 %
Neutrophils Absolute: 4.1 10*3/uL (ref 1.4–7.0)
Neutrophils: 62 %
Platelets: 235 10*3/uL (ref 150–450)
RBC: 4.06 x10E6/uL (ref 3.77–5.28)
RDW: 12.9 % (ref 11.7–15.4)
RPR Ser Ql: NONREACTIVE
Rh Factor: POSITIVE
Rubella Antibodies, IGG: 2.38 index (ref >0.99)
WBC: 6.7 10*3/uL (ref 3.4–10.8)

## 2022-10-13 LAB — HEMOGLOBIN A1C
Est. average glucose Bld gHb Est-mCnc: 97 mg/dL
Hgb A1c MFr Bld: 5 % (ref 4.8–5.6)

## 2022-10-13 LAB — HCV INTERPRETATION

## 2022-10-14 LAB — URINE CULTURE, OB REFLEX

## 2022-10-14 LAB — CULTURE, OB URINE

## 2022-10-19 LAB — PANORAMA PRENATAL TEST FULL PANEL:PANORAMA TEST PLUS 5 ADDITIONAL MICRODELETIONS: FETAL FRACTION: 17.7

## 2022-10-20 LAB — HORIZON CUSTOM: REPORT SUMMARY: NEGATIVE

## 2022-10-22 DIAGNOSIS — O26899 Other specified pregnancy related conditions, unspecified trimester: Secondary | ICD-10-CM | POA: Diagnosis not present

## 2022-10-22 DIAGNOSIS — O2312 Infections of bladder in pregnancy, second trimester: Secondary | ICD-10-CM | POA: Diagnosis not present

## 2022-10-22 DIAGNOSIS — Z3A00 Weeks of gestation of pregnancy not specified: Secondary | ICD-10-CM | POA: Diagnosis not present

## 2022-10-22 DIAGNOSIS — J45909 Unspecified asthma, uncomplicated: Secondary | ICD-10-CM | POA: Diagnosis not present

## 2022-10-22 DIAGNOSIS — O99891 Other specified diseases and conditions complicating pregnancy: Secondary | ICD-10-CM | POA: Diagnosis not present

## 2022-10-22 DIAGNOSIS — G43909 Migraine, unspecified, not intractable, without status migrainosus: Secondary | ICD-10-CM | POA: Diagnosis not present

## 2022-10-22 DIAGNOSIS — O99352 Diseases of the nervous system complicating pregnancy, second trimester: Secondary | ICD-10-CM | POA: Diagnosis not present

## 2022-10-22 DIAGNOSIS — O99512 Diseases of the respiratory system complicating pregnancy, second trimester: Secondary | ICD-10-CM | POA: Diagnosis not present

## 2022-10-22 DIAGNOSIS — Z3A15 15 weeks gestation of pregnancy: Secondary | ICD-10-CM | POA: Diagnosis not present

## 2022-10-22 DIAGNOSIS — R1032 Left lower quadrant pain: Secondary | ICD-10-CM | POA: Diagnosis not present

## 2022-10-22 DIAGNOSIS — N3000 Acute cystitis without hematuria: Secondary | ICD-10-CM | POA: Diagnosis not present

## 2022-10-22 DIAGNOSIS — O26892 Other specified pregnancy related conditions, second trimester: Secondary | ICD-10-CM | POA: Diagnosis not present

## 2022-10-31 ENCOUNTER — Ambulatory Visit (INDEPENDENT_AMBULATORY_CARE_PROVIDER_SITE_OTHER): Payer: Federal, State, Local not specified - PPO | Admitting: Advanced Practice Midwife

## 2022-10-31 VITALS — BP 113/75 | HR 97 | Wt 109.0 lb

## 2022-10-31 DIAGNOSIS — Z3492 Encounter for supervision of normal pregnancy, unspecified, second trimester: Secondary | ICD-10-CM

## 2022-10-31 DIAGNOSIS — Z3A17 17 weeks gestation of pregnancy: Secondary | ICD-10-CM

## 2022-10-31 DIAGNOSIS — B009 Herpesviral infection, unspecified: Secondary | ICD-10-CM

## 2022-11-01 ENCOUNTER — Encounter: Payer: Self-pay | Admitting: Advanced Practice Midwife

## 2022-11-02 NOTE — Progress Notes (Signed)
       PRENATAL VISIT NOTE- Centering Pregnancy Cycle 12, Session # 1  Subjective:  Glenda Adams is a 27 y.o. G1P0 at [redacted]w[redacted]d being seen today for ongoing prenatal care through Centering Pregnancy.  She is currently monitored for the following issues for this low-risk pregnancy and has Mild intermittent asthma without complication; HSV infection; and Supervision of low-risk pregnancy on their problem list.  Patient reports no complaints.   . Vag. Bleeding: None.   . Denies leaking of fluid/ROM.   The following portions of the patient's history were reviewed and updated as appropriate: allergies, current medications, past family history, past medical history, past social history, past surgical history and problem list. Problem list updated.  Objective:   Vitals:   10/31/22 1017  BP: 113/75  Pulse: 97  Weight: 109 lb (49.4 kg)    Fetal Status: Fetal Heart Rate (bpm): 145         General:  Alert, oriented and cooperative. Patient is in no acute distress.  Skin: Skin is warm and dry. No rash noted.   Cardiovascular: Normal heart rate noted  Respiratory: Normal respiratory effort, no problems with respiration noted  Abdomen: Soft, gravid, appropriate for gestational age.  Pain/Pressure: Absent     Pelvic: Cervical exam deferred        Extremities: Normal range of motion.  Edema: None  Mental Status: Normal mood and affect. Normal behavior. Normal judgment and thought content.   Assessment and Plan:  Pregnancy: G1P0 at [redacted]w[redacted]d  1. Encounter for supervision of low-risk pregnancy in second trimester   Centering Pregnancy, Session#1: Introduction to model of care. Group determined rules for self-governance and closing phrase. Oriented group to space and mother's notebook.   Facilitated discussion today:  common discomforts, When to call practice  Mindfulness activity completed as well as introduction to deep breathing for childbirth preparation- Centering 3 Breaths  Fundal height and  FHR appropriate today unless noted otherwise in plan of care. Patient to continue group care.    2. HSV infection --Prophylaxis at 34-36 weeks  3. [redacted] weeks gestation of pregnancy    Preterm labor symptoms and general obstetric precautions including but not limited to vaginal bleeding, contractions, leaking of fluid and fetal movement were reviewed in detail with the patient. Please refer to After Visit Summary for other counseling recommendations.  No follow-ups on file.  Future Appointments  Date Time Provider Department Center  11/18/2022  9:00 AM [facility] US1 [facility]  11/28/2022  9:00 AM CENTERING PROVIDER [facility]  12/26/2022  9:00 AM CENTERING PROVIDER [facility]  01/09/2023  9:00 AM CENTERING PROVIDER [facility]  01/23/2023  9:00 AM CENTERING PROVIDER [facility]  02/06/2023  9:00 AM CENTERING PROVIDER [facility]  02/20/2023  9:00 AM CENTERING PROVIDER [facility]  03/06/2023  9:00 AM CENTERING PROVIDER [facility]  03/20/2023  9:00 AM CENTERING PROVIDER [facility]  04/03/2023  9:00 AM CENTERING PROVIDER [facility]    Sharen Counter, CNM

## 2022-11-18 ENCOUNTER — Ambulatory Visit: Payer: Federal, State, Local not specified - PPO | Attending: Certified Nurse Midwife

## 2022-11-18 DIAGNOSIS — O358XX0 Maternal care for other (suspected) fetal abnormality and damage, not applicable or unspecified: Secondary | ICD-10-CM | POA: Insufficient documentation

## 2022-11-18 DIAGNOSIS — Z3480 Encounter for supervision of other normal pregnancy, unspecified trimester: Secondary | ICD-10-CM

## 2022-11-18 DIAGNOSIS — Z3A19 19 weeks gestation of pregnancy: Secondary | ICD-10-CM | POA: Insufficient documentation

## 2022-11-18 DIAGNOSIS — O321XX0 Maternal care for breech presentation, not applicable or unspecified: Secondary | ICD-10-CM | POA: Diagnosis not present

## 2022-11-18 DIAGNOSIS — Z363 Encounter for antenatal screening for malformations: Secondary | ICD-10-CM | POA: Diagnosis not present

## 2022-11-27 NOTE — Progress Notes (Unsigned)
       PRENATAL VISIT NOTE- Centering Pregnancy Cycle {NUMBERS:20191}, Session # {NUMBERS:20191}  Subjective:  Glenda Adams is a 27 y.o. G1P0 at [redacted]w[redacted]d being seen today for ongoing prenatal care through Centering Pregnancy.  She is currently monitored for the following issues for this {Blank single:19197::"high-risk","low-risk"} pregnancy and has HSV infection and Supervision of low-risk pregnancy on their problem list.  Patient reports {sx:14538}.   .  .   . ***Denies leaking of fluid/ROM.   The following portions of the patient's history were reviewed and updated as appropriate: allergies, current medications, past family history, past medical history, past social history, past surgical history and problem list. Problem list updated.  Objective:  There were no vitals filed for this visit.  Fetal Status:           General:  Alert, oriented and cooperative. Patient is in no acute distress.  Skin: Skin is warm and dry. No rash noted.   Cardiovascular: Normal heart rate noted  Respiratory: Normal respiratory effort, no problems with respiration noted  Abdomen: Soft, gravid, appropriate for gestational age.        Pelvic: {Blank single:19197::"Cervical exam performed","Cervical exam deferred"}        Extremities: Normal range of motion.     Mental Status: Normal mood and affect. Normal behavior. Normal judgment and thought content.   Assessment and Plan:  Pregnancy: G1P0 at [redacted]w[redacted]d  1. Encounter for supervision of low-risk pregnancy in second trimester ***  2. [redacted] weeks gestation of pregnancy ***   *** add [facility] dot phrase for session   {Blank single:19197::"Term","Preterm"} labor symptoms and general obstetric precautions including but not limited to vaginal bleeding, contractions, leaking of fluid and fetal movement were reviewed in detail with the patient. Please refer to After Visit Summary for other counseling recommendations.  No follow-ups on file.  Future Appointments   Date Time Provider Department Center  11/28/2022  9:00 AM CENTERING PROVIDER [facility]  12/26/2022  9:00 AM CENTERING PROVIDER [facility]  01/09/2023  9:00 AM CENTERING PROVIDER [facility]  01/23/2023  9:00 AM CENTERING PROVIDER [facility]  02/06/2023  9:00 AM CENTERING PROVIDER [facility]  02/20/2023  9:00 AM CENTERING PROVIDER [facility]  03/06/2023  9:00 AM CENTERING PROVIDER [facility]  03/20/2023  9:00 AM CENTERING PROVIDER [facility]  04/03/2023  9:00 AM CENTERING PROVIDER [facility]    Sharen Counter, CNM

## 2022-11-28 ENCOUNTER — Ambulatory Visit: Payer: Federal, State, Local not specified - PPO | Admitting: Advanced Practice Midwife

## 2022-11-28 VITALS — BP 109/74 | HR 96 | Wt 113.4 lb

## 2022-11-28 DIAGNOSIS — O98312 Other infections with a predominantly sexual mode of transmission complicating pregnancy, second trimester: Secondary | ICD-10-CM

## 2022-11-28 DIAGNOSIS — A6009 Herpesviral infection of other urogenital tract: Secondary | ICD-10-CM

## 2022-11-28 DIAGNOSIS — Z3492 Encounter for supervision of normal pregnancy, unspecified, second trimester: Secondary | ICD-10-CM

## 2022-11-28 DIAGNOSIS — Z3A21 21 weeks gestation of pregnancy: Secondary | ICD-10-CM

## 2022-12-26 ENCOUNTER — Ambulatory Visit (INDEPENDENT_AMBULATORY_CARE_PROVIDER_SITE_OTHER): Payer: Federal, State, Local not specified - PPO | Admitting: Advanced Practice Midwife

## 2022-12-26 DIAGNOSIS — R12 Heartburn: Secondary | ICD-10-CM

## 2022-12-26 DIAGNOSIS — A6009 Herpesviral infection of other urogenital tract: Secondary | ICD-10-CM

## 2022-12-26 DIAGNOSIS — O26892 Other specified pregnancy related conditions, second trimester: Secondary | ICD-10-CM

## 2022-12-26 DIAGNOSIS — Z3492 Encounter for supervision of normal pregnancy, unspecified, second trimester: Secondary | ICD-10-CM

## 2022-12-26 DIAGNOSIS — Z3A25 25 weeks gestation of pregnancy: Secondary | ICD-10-CM

## 2022-12-26 DIAGNOSIS — O98312 Other infections with a predominantly sexual mode of transmission complicating pregnancy, second trimester: Secondary | ICD-10-CM

## 2022-12-26 MED ORDER — FAMOTIDINE 20 MG PO TABS: 20.0000 mg | ORAL_TABLET | Freq: Two times a day (BID) | ORAL | 2 refills | Status: AC

## 2022-12-26 NOTE — Progress Notes (Signed)
       PRENATAL VISIT NOTE- Centering Pregnancy Cycle 12, Session # 3  Subjective:  Glenda Adams is a 27 y.o. G1P0 at [redacted]w[redacted]d being seen today for ongoing prenatal care through Centering Pregnancy.  She is currently monitored for the following issues for this low-risk pregnancy and has HSV infection and Supervision of low-risk pregnancy on their problem list.  Patient reports no complaints.   . Vag. Bleeding: None.  Movement: Present. Denies leaking of fluid/ROM.   The following portions of the patient's history were reviewed and updated as appropriate: allergies, current medications, past family history, past medical history, past social history, past surgical history and problem list. Problem list updated.  Objective:  There were no vitals filed for this visit.  Fetal Status: Fetal Heart Rate (bpm): 140 Fundal Height: 24 cm Movement: Present     General:  Alert, oriented and cooperative. Patient is in no acute distress.  Skin: Skin is warm and dry. No rash noted.   Cardiovascular: Normal heart rate noted  Respiratory: Normal respiratory effort, no problems with respiration noted  Abdomen: Soft, gravid, appropriate for gestational age.  Pain/Pressure: Absent     Pelvic: Cervical exam deferred        Extremities: Normal range of motion.  Edema: None  Mental Status: Normal mood and affect. Normal behavior. Normal judgment and thought content.   Assessment and Plan:  Pregnancy: G1P0 at [redacted]w[redacted]d  1. Encounter for supervision of low-risk pregnancy in second trimester --Anticipatory guidance about next visits/weeks of pregnancy given.   Centering Pregnancy, Session#3: Reviewed resources in [employer].   Facilitated discussion today:  Stress/Stress reduction, breastfeeding/infant nutrition, oral health, mindfulness activity completed as well as deep breathing/relaxation/centering  Fundal height and FHR appropriate today unless noted otherwise in plan. Patient to continue group care.     2. Genital herpes simplex virus (HSV) infection in mother affecting pregnancy --Prophylaxis at 34 weeks  3. [redacted] weeks gestation of pregnancy   4. Heartburn during pregnancy in second trimester --Reviewed dietary changes for acid reflux  - famotidine (PEPCID) 20 MG tablet; Take 1 tablet (20 mg total) by mouth 2 (two) times daily.  Dispense: 60 tablet; Refill: 2   Preterm labor symptoms and general obstetric precautions including but not limited to vaginal bleeding, contractions, leaking of fluid and fetal movement were reviewed in detail with the patient. Please refer to After Visit Summary for other counseling recommendations.  Return in about 2 weeks (around 01/09/2023) for Centering Sessions as scheduled.  Future Appointments  Date Time Provider Department Center  12/29/2022 10:00 AM [facility] LAB [facility]  01/25/2023  9:15 AM Bernerd Limbo, CNM [facility]    Sharen Counter, CNM

## 2022-12-29 ENCOUNTER — Other Ambulatory Visit: Payer: Federal, State, Local not specified - PPO

## 2022-12-29 DIAGNOSIS — Z3492 Encounter for supervision of normal pregnancy, unspecified, second trimester: Secondary | ICD-10-CM | POA: Diagnosis not present

## 2022-12-30 LAB — CBC
Hematocrit: 28.2 % — ABNORMAL LOW (ref 34.0–46.6)
Hemoglobin: 9.2 g/dL — ABNORMAL LOW (ref 11.1–15.9)
MCH: 29.3 pg (ref 26.6–33.0)
MCHC: 32.6 g/dL (ref 31.5–35.7)
MCV: 90 fL (ref 79–97)
Platelets: 205 10*3/uL (ref 150–450)
RBC: 3.14 x10E6/uL — ABNORMAL LOW (ref 3.77–5.28)
RDW: 12.4 % (ref 11.7–15.4)
WBC: 8 10*3/uL (ref 3.4–10.8)

## 2022-12-30 LAB — GLUCOSE TOLERANCE, 2 HOURS W/ 1HR
Glucose, 1 hour: 190 mg/dL — ABNORMAL HIGH (ref 70–179)
Glucose, 2 hour: 143 mg/dL (ref 70–152)
Glucose, Fasting: 74 mg/dL (ref 70–91)

## 2022-12-30 LAB — RPR: RPR Ser Ql: NONREACTIVE

## 2022-12-30 LAB — HIV ANTIBODY (ROUTINE TESTING W REFLEX): HIV Screen 4th Generation wRfx: NONREACTIVE

## 2023-01-01 ENCOUNTER — Encounter: Payer: Self-pay | Admitting: Certified Nurse Midwife

## 2023-01-01 ENCOUNTER — Other Ambulatory Visit: Payer: Self-pay | Admitting: Certified Nurse Midwife

## 2023-01-01 DIAGNOSIS — O24419 Gestational diabetes mellitus in pregnancy, unspecified control: Secondary | ICD-10-CM | POA: Insufficient documentation

## 2023-01-01 DIAGNOSIS — D509 Iron deficiency anemia, unspecified: Secondary | ICD-10-CM | POA: Insufficient documentation

## 2023-01-01 MED ORDER — POLYSACCHARIDE IRON COMPLEX 150 MG PO CAPS
150.0000 mg | ORAL_CAPSULE | Freq: Every day | ORAL | 4 refills | Status: DC
Start: 2023-01-01 — End: 2023-09-25

## 2023-01-01 NOTE — Progress Notes (Signed)
Iron infusion orders for iron deficiency anemia in pregnancy.

## 2023-01-02 ENCOUNTER — Telehealth: Payer: Self-pay

## 2023-01-02 DIAGNOSIS — O24419 Gestational diabetes mellitus in pregnancy, unspecified control: Secondary | ICD-10-CM

## 2023-01-02 MED ORDER — ACCU-CHEK GUIDE W/DEVICE KIT
1.0000 | PACK | Freq: Once | 0 refills | Status: AC
Start: 2023-01-02 — End: 2023-01-02

## 2023-01-02 MED ORDER — ACCU-CHEK GUIDE VI STRP
ORAL_STRIP | 11 refills | Status: DC
Start: 2023-01-02 — End: 2023-02-24

## 2023-01-02 MED ORDER — ACCU-CHEK SOFTCLIX LANCETS MISC
11 refills | Status: DC
Start: 2023-01-02 — End: 2023-02-24

## 2023-01-02 NOTE — Telephone Encounter (Addendum)
-----   Message from Bernerd Limbo, [place] sent at 01/01/2023 12:58 AM EDT ----- Please send testing kit and get her scheduled with diabetes educator. Thank you!   Called pt; call went straight to VM which has not been set up. MyChart message sent. Supplies and diabetes education referral ordered.

## 2023-01-04 ENCOUNTER — Other Ambulatory Visit: Payer: Self-pay | Admitting: Pharmacy Technician

## 2023-01-04 ENCOUNTER — Telehealth: Payer: Self-pay

## 2023-01-04 NOTE — Telephone Encounter (Signed)
Auth Submission: NO AUTH NEEDED Site of care: Site of care: [facility] Payer: BCBS Medication & CPT/J Code(s) submitted: Venofer (Iron Sucrose) J1756 Route of submission (phone, fax, portal):  Phone # Fax # Auth type: Buy/Bill Units/visits requested: 2 Approval from: 01/04/2023 to 05/06/2023

## 2023-01-12 ENCOUNTER — Encounter: Payer: Self-pay | Admitting: General Practice

## 2023-01-24 ENCOUNTER — Encounter
Payer: Federal, State, Local not specified - PPO | Attending: Certified Nurse Midwife | Admitting: Skilled Nursing Facility1

## 2023-01-24 ENCOUNTER — Ambulatory Visit (INDEPENDENT_AMBULATORY_CARE_PROVIDER_SITE_OTHER): Payer: Federal, State, Local not specified - PPO | Admitting: Registered"

## 2023-01-24 ENCOUNTER — Other Ambulatory Visit: Payer: Self-pay

## 2023-01-24 DIAGNOSIS — O24419 Gestational diabetes mellitus in pregnancy, unspecified control: Secondary | ICD-10-CM | POA: Diagnosis not present

## 2023-01-24 DIAGNOSIS — Z3A29 29 weeks gestation of pregnancy: Secondary | ICD-10-CM

## 2023-01-24 DIAGNOSIS — Z713 Dietary counseling and surveillance: Secondary | ICD-10-CM | POA: Diagnosis not present

## 2023-01-24 NOTE — Progress Notes (Unsigned)
Patient was seen for Gestational Diabetes self-management on 01/24/23  Start time 1640 and End time 1715    Estimated due date: 04/08/23; [redacted]w[redacted]d  Next Ob visit on 01/25/23 with Edd Ar[facility]  Clinical: Medications: reviewed Medical History: reviewed Labs: OGTT  Lab Results  Component Value Date   HGBA1C 5.0 10/12/2022    Patient has a meter prior to visit, brought to visit for instruction. CBG: 97 mg/dL  Dietary and Lifestyle History: Pt states her boyfriend is a pescetarian and they have been eating a lot of fish, but will reduce to 2x/week. Pt states she is lactose intolerant and doesn't eat cheese.  PT states she doesn't eat a lot of sweets, but likes pasta.  Physical Activity: not assessed Stress: high stress, is in the process of moving Sleep: not assessed  24 hr Recall: not assessed  NUTRITION INTERVENTION  Nutrition education (E-1) on the following topics:   Initial Follow-up  [x]  []  Definition of Gestational Diabetes [x]  []  Why dietary management is important in controlling blood glucose [x]  []  Effects each nutrient has on blood glucose levels [x]  []  Simple carbohydrates vs complex carbohydrates []  []  Fluid intake [x]  []  Creating a balanced meal plan [x]  []  Carbohydrate counting  [x]  []  When to check blood glucose levels [x]  []  Proper blood glucose monitoring techniques [x]  []  Effect of stress and stress reduction techniques  [x]  []  Exercise effect on blood glucose levels, appropriate exercise during pregnancy []  []  Importance of limiting caffeine and abstaining from alcohol and smoking []  []  Medications used for blood sugar control during pregnancy []  []  Hypoglycemia and rule of 15 []  []  Postpartum self care  Patient instructed to monitor glucose levels: FBS: 60 - ? 95 mg/dL, 2 hour: ? 161 mg/dL  Patient received handouts: Nutrition Diabetes and Pregnancy Carbohydrate Counting List  Patient will be seen for follow-up as needed.

## 2023-01-25 ENCOUNTER — Other Ambulatory Visit (HOSPITAL_COMMUNITY)
Admission: RE | Admit: 2023-01-25 | Discharge: 2023-01-25 | Disposition: A | Discharge status: Home / Self Care | Payer: Federal, State, Local not specified - PPO | Source: Ambulatory Visit | Attending: Certified Nurse Midwife | Admitting: Certified Nurse Midwife

## 2023-01-25 ENCOUNTER — Ambulatory Visit (INDEPENDENT_AMBULATORY_CARE_PROVIDER_SITE_OTHER): Payer: Federal, State, Local not specified - PPO | Admitting: Certified Nurse Midwife

## 2023-01-25 VITALS — BP 113/72 | HR 94 | Wt 121.8 lb

## 2023-01-25 DIAGNOSIS — O24410 Gestational diabetes mellitus in pregnancy, diet controlled: Secondary | ICD-10-CM

## 2023-01-25 DIAGNOSIS — N898 Other specified noninflammatory disorders of vagina: Secondary | ICD-10-CM | POA: Insufficient documentation

## 2023-01-25 DIAGNOSIS — D509 Iron deficiency anemia, unspecified: Secondary | ICD-10-CM

## 2023-01-25 DIAGNOSIS — Z3493 Encounter for supervision of normal pregnancy, unspecified, third trimester: Secondary | ICD-10-CM

## 2023-01-25 DIAGNOSIS — O99013 Anemia complicating pregnancy, third trimester: Secondary | ICD-10-CM

## 2023-01-25 DIAGNOSIS — Z3A29 29 weeks gestation of pregnancy: Secondary | ICD-10-CM

## 2023-01-25 NOTE — Progress Notes (Unsigned)
   PRENATAL VISIT NOTE  Subjective:  Glenda Adams is a 27 y.o. G1P0 at [redacted]w[redacted]d being seen today for ongoing prenatal care.  She is currently monitored for the following issues for this {Blank single:19197::"high-risk","low-risk"} pregnancy and has HSV infection; Supervision of low-risk pregnancy; Iron deficiency anemia during pregnancy; and Gestational diabetes on their problem list.  Patient reports {sx:14538}.  Contractions: Not present. Vag. Bleeding: None.  Movement: Present. Denies leaking of fluid.   The following portions of the patient's history were reviewed and updated as appropriate: allergies, current medications, past family history, past medical history, past social history, past surgical history and problem list.   Objective:   Vitals:   01/25/23 0929  BP: 113/72  Pulse: 94  Weight: 121 lb 12.8 oz (55.2 kg)   Fetal Status: Fetal Heart Rate (bpm): 157   Movement: Present     General:  Alert, oriented and cooperative. Patient is in no acute distress.  Skin: Skin is warm and dry. No rash noted.   Cardiovascular: Normal heart rate noted  Respiratory: Normal respiratory effort, no problems with respiration noted  Abdomen: Soft, gravid, appropriate for gestational age.  Pain/Pressure: Absent     Pelvic: {Blank single:19197::"Cervical exam performed in the presence of a chaperone","Cervical exam deferred"}        Extremities: Normal range of motion.     Mental Status: Normal mood and affect. Normal behavior. Normal judgment and thought content.   Assessment and Plan:  Pregnancy: G1P0 at [redacted]w[redacted]d 1. Encounter for supervision of low-risk pregnancy in third trimester ***  2. [redacted] weeks gestation of pregnancy ***  3. Iron deficiency anemia during pregnancy ***  4. Diet controlled gestational diabetes mellitus (GDM) in third trimester ***  5. Vaginal irritation *** - Cervicovaginal ancillary only( [place])  {Blank single:19197::"Term","Preterm"} labor symptoms and  general obstetric precautions including but not limited to vaginal bleeding, contractions, leaking of fluid and fetal movement were reviewed in detail with the patient. Please refer to After Visit Summary for other counseling recommendations.   No follow-ups on file.  No future appointments.  Bernerd Limbo, CNM

## 2023-01-27 LAB — CERVICOVAGINAL ANCILLARY ONLY
Bacterial Vaginitis (gardnerella): NEGATIVE
Candida Glabrata: NEGATIVE
Candida Vaginitis: POSITIVE — AB
Chlamydia: NEGATIVE
Comment: NEGATIVE
Comment: NEGATIVE
Comment: NEGATIVE
Comment: NEGATIVE
Comment: NEGATIVE
Comment: NORMAL
Neisseria Gonorrhea: NEGATIVE
Trichomonas: NEGATIVE

## 2023-01-31 ENCOUNTER — Telehealth: Payer: Self-pay | Admitting: Lactation Services

## 2023-01-31 MED ORDER — TERCONAZOLE 0.4 % VA CREA: 1.0000 | TOPICAL_CREAM | Freq: Every day | VAGINAL | 0 refills | Status: AC

## 2023-01-31 NOTE — Telephone Encounter (Signed)
Attempted to call patient with results of vaginal swab. She did not answer. LM for her to check her My Chart message and that prescription has been sent to her pharmacy.

## 2023-02-07 NOTE — Progress Notes (Unsigned)
   PRENATAL VISIT NOTE  Subjective:  Glenda Adams is a 27 y.o. G1P0 at [redacted]w[redacted]d being seen today for ongoing prenatal care.  She is currently monitored for the following issues for this {Blank single:19197::"high-risk","low-risk"} pregnancy and has HSV infection; Supervision of low-risk pregnancy; Iron deficiency anemia during pregnancy; and Gestational diabetes on their problem list.  Patient reports {sx:[id number]}.   .  .   . Denies leaking of fluid.   The following portions of the patient's history were reviewed and updated as appropriate: allergies, current medications, past family history, past medical history, past social history, past surgical history and problem list.   Objective:  There were no vitals filed for this visit.  Fetal Status:           General:  Alert, oriented and cooperative. Patient is in no acute distress.  Skin: Skin is warm and dry. No rash noted.   Cardiovascular: Normal heart rate noted  Respiratory: Normal respiratory effort, no problems with respiration noted  Abdomen: Soft, gravid, appropriate for gestational age.        Pelvic: {Blank single:19197::"Cervical exam performed in the presence of a chaperone","Cervical exam deferred"}        Extremities: Normal range of motion.     Mental Status: Normal mood and affect. Normal behavior. Normal judgment and thought content.   Assessment and Plan:  Pregnancy: G1P0 at [redacted]w[redacted]d 1. Encounter for supervision of low-risk pregnancy in third trimester ***  2. [redacted] weeks gestation of pregnancy ***  3. Diet controlled gestational diabetes mellitus (GDM) in third trimester ***  {Blank single:19197::"Term","Preterm"} labor symptoms and general obstetric precautions including but not limited to vaginal bleeding, contractions, leaking of fluid and fetal movement were reviewed in detail with the patient. Please refer to After Visit Summary for other counseling recommendations.   No follow-ups on file.  Future Appointments   Date Time Provider Department Center  02/08/2023  3:35 PM Osborne Oman [facility]  02/22/2023  3:55 PM Osborne Oman [facility]  03/08/2023  4:15 PM Osborne Oman [facility]  03/14/2023  4:15 PM Osborne Oman [facility]  03/22/2023  4:15 PM Bernerd Limbo, CNM [facility]    Bernerd Limbo, CNM

## 2023-02-08 ENCOUNTER — Ambulatory Visit (INDEPENDENT_AMBULATORY_CARE_PROVIDER_SITE_OTHER): Payer: Federal, State, Local not specified - PPO | Admitting: Certified Nurse Midwife

## 2023-02-08 VITALS — BP 105/69 | HR 98

## 2023-02-08 DIAGNOSIS — Z3493 Encounter for supervision of normal pregnancy, unspecified, third trimester: Secondary | ICD-10-CM

## 2023-02-08 DIAGNOSIS — O24410 Gestational diabetes mellitus in pregnancy, diet controlled: Secondary | ICD-10-CM

## 2023-02-08 DIAGNOSIS — Z3A31 31 weeks gestation of pregnancy: Secondary | ICD-10-CM

## 2023-02-08 MED ORDER — DEXCOM G7 SENSOR MISC
11 refills | Status: AC
Start: 2023-02-08 — End: ?

## 2023-02-08 NOTE — Progress Notes (Signed)
Patient stated that she has been having braxton hx more frequently. She explained that braxton hx would come every "few minutes".They became more frequent since the ending of last week. Rates pain 6/10.    Denies VB and LOF. Reports good movement from baby.    Josten Warmuth, CCMA

## 2023-02-08 NOTE — Progress Notes (Signed)
I connected with  Leodis Liverpool on 02/08/23 at  3:35 PM EDT by MyChart Virtual Video Visit and verified that I am speaking with the correct person using two identifiers.   I discussed the limitations, risks, security and privacy concerns of performing an evaluation and management service by telephone and the availability of in person appointments. I also discussed with the patient that there may be a patient responsible charge related to this service. The patient expressed understanding and agreed to proceed.  Guy Begin, CMA 02/08/2023  3:33 PM

## 2023-02-21 NOTE — Progress Notes (Signed)
   PRENATAL VISIT NOTE  Subjective:  Glenda Adams is a 27 y.o. G1P0 at [redacted]w[redacted]d being seen today for ongoing prenatal care.  She is currently monitored for the following issues for this high-risk pregnancy and has HSV infection; Supervision of high-risk pregnancy; Iron deficiency anemia during pregnancy; and Gestational diabetes on their problem list.  Patient reports no complaints.  Contractions: Not present. Vag. Bleeding: None.  Movement: Present. Denies leaking of fluid.   The following portions of the patient's history were reviewed and updated as appropriate: allergies, current medications, past family history, past medical history, past social history, past surgical history and problem list.   Objective:   Vitals:   02/22/23 1616  BP: 124/86  Pulse: (!) 103  Weight: 127 lb (57.6 kg)    Fetal Status: Fetal Heart Rate (bpm): 147 Fundal Height: 34 cm Movement: Present     General:  Alert, oriented and cooperative. Patient is in no acute distress.  Skin: Skin is warm and dry. No rash noted.   Cardiovascular: Normal heart rate noted  Respiratory: Normal respiratory effort, no problems with respiration noted  Abdomen: Soft, gravid, appropriate for gestational age.  Pain/Pressure: Absent     Pelvic: Cervical exam deferred        Extremities: Normal range of motion.  Edema: None  Mental Status: Normal mood and affect. Normal behavior. Normal judgment and thought content.   Assessment and Plan:  Pregnancy: G1P0 at [redacted]w[redacted]d 1. Encounter for supervision of low-risk pregnancy in third trimester - Doing well, feeling regular and vigorous fetal movement   2. [redacted] weeks gestation of pregnancy - Routine OB care   3. Diet controlled gestational diabetes mellitus (GDM) in third trimester - Most values appear to be in range, has had a few readings 140-160 in the first hour after eating. This was noted after long periods without eating. Otherwise very well controlled, at times too low. - Requested  she mark when she eats in her CGM app so we can track how her blood sugar is responding to eating - Reminded her to try to eat every 3hrs throughout the day to prevent those blood sugar spikes.  4. Iron deficiency anemia during pregnancy - No s/sx anemia, taking oral iron - Offered infusions but cannot go due to work schedule  Preterm labor symptoms and general obstetric precautions including but not limited to vaginal bleeding, contractions, leaking of fluid and fetal movement were reviewed in detail with the patient. Please refer to After Visit Summary for other counseling recommendations.   Return in about 2 weeks (around 03/08/2023) for IN-PERSON, HOB.  Future Appointments  Date Time Provider Department Center  03/01/2023  3:30 PM [facility] NURSE [facility]  03/01/2023  3:45 PM [facility]  03/08/2023  4:15 PM Osborne Oman [facility]  03/14/2023  4:15 PM Osborne Oman [facility]  03/22/2023  4:15 PM Bernerd Limbo, CNM [facility]    Bernerd Limbo, CNM

## 2023-02-22 ENCOUNTER — Telehealth: Payer: Self-pay | Admitting: Lactation Services

## 2023-02-22 ENCOUNTER — Other Ambulatory Visit: Payer: Self-pay

## 2023-02-22 ENCOUNTER — Ambulatory Visit (INDEPENDENT_AMBULATORY_CARE_PROVIDER_SITE_OTHER): Payer: Federal, State, Local not specified - PPO | Admitting: Certified Nurse Midwife

## 2023-02-22 VITALS — BP 124/86 | HR 103 | Wt 127.0 lb

## 2023-02-22 DIAGNOSIS — O99013 Anemia complicating pregnancy, third trimester: Secondary | ICD-10-CM

## 2023-02-22 DIAGNOSIS — Z3A33 33 weeks gestation of pregnancy: Secondary | ICD-10-CM

## 2023-02-22 DIAGNOSIS — D509 Iron deficiency anemia, unspecified: Secondary | ICD-10-CM

## 2023-02-22 DIAGNOSIS — Z3493 Encounter for supervision of normal pregnancy, unspecified, third trimester: Secondary | ICD-10-CM

## 2023-02-22 DIAGNOSIS — O0993 Supervision of high risk pregnancy, unspecified, third trimester: Secondary | ICD-10-CM

## 2023-02-22 DIAGNOSIS — O24410 Gestational diabetes mellitus in pregnancy, diet controlled: Secondary | ICD-10-CM | POA: Diagnosis not present

## 2023-02-22 NOTE — Telephone Encounter (Signed)
Received PA approval for Dexcom G7. Called Pharmacy to inform them if has been approved. Was not able to reach a live person. LM re PA approval. Will send My Chart message to patient.

## 2023-02-25 DIAGNOSIS — D509 Iron deficiency anemia, unspecified: Secondary | ICD-10-CM | POA: Diagnosis not present

## 2023-02-25 DIAGNOSIS — Z3A34 34 weeks gestation of pregnancy: Secondary | ICD-10-CM | POA: Diagnosis not present

## 2023-02-25 DIAGNOSIS — O43123 Velamentous insertion of umbilical cord, third trimester: Secondary | ICD-10-CM | POA: Diagnosis not present

## 2023-02-25 DIAGNOSIS — O24429 Gestational diabetes mellitus in childbirth, unspecified control: Secondary | ICD-10-CM | POA: Diagnosis not present

## 2023-02-25 DIAGNOSIS — O9902 Anemia complicating childbirth: Secondary | ICD-10-CM | POA: Diagnosis not present

## 2023-03-01 ENCOUNTER — Ambulatory Visit: Payer: Federal, State, Local not specified - PPO

## 2023-03-01 ENCOUNTER — Other Ambulatory Visit: Payer: Self-pay | Admitting: Lactation Services

## 2023-03-01 MED ORDER — VALACYCLOVIR HCL 500 MG PO TABS
500.0000 mg | ORAL_TABLET | Freq: Two times a day (BID) | ORAL | 99 refills | Status: AC
Start: 2023-03-01 — End: ?

## 2023-03-08 ENCOUNTER — Encounter: Payer: Federal, State, Local not specified - PPO | Admitting: Certified Nurse Midwife

## 2023-03-14 ENCOUNTER — Encounter: Payer: Federal, State, Local not specified - PPO | Admitting: Certified Nurse Midwife

## 2023-03-22 ENCOUNTER — Encounter: Payer: Self-pay | Admitting: Certified Nurse Midwife

## 2023-04-03 DIAGNOSIS — O9833 Other infections with a predominantly sexual mode of transmission complicating the puerperium: Secondary | ICD-10-CM | POA: Diagnosis not present

## 2023-04-03 DIAGNOSIS — O9853 Other viral diseases complicating the puerperium: Secondary | ICD-10-CM | POA: Diagnosis not present

## 2023-04-03 DIAGNOSIS — O99345 Other mental disorders complicating the puerperium: Secondary | ICD-10-CM | POA: Diagnosis not present

## 2023-04-03 DIAGNOSIS — B009 Herpesviral infection, unspecified: Secondary | ICD-10-CM | POA: Diagnosis not present

## 2023-04-03 DIAGNOSIS — A609 Anogenital herpesviral infection, unspecified: Secondary | ICD-10-CM | POA: Diagnosis not present

## 2023-04-03 DIAGNOSIS — F53 Postpartum depression: Secondary | ICD-10-CM | POA: Diagnosis not present

## 2023-06-08 DIAGNOSIS — Z0000 Encounter for general adult medical examination without abnormal findings: Secondary | ICD-10-CM | POA: Diagnosis not present

## 2023-09-07 DIAGNOSIS — N6002 Solitary cyst of left breast: Secondary | ICD-10-CM | POA: Diagnosis not present

## 2023-09-21 ENCOUNTER — Other Ambulatory Visit: Payer: Self-pay | Admitting: Certified Nurse Midwife

## 2023-09-21 DIAGNOSIS — D509 Iron deficiency anemia, unspecified: Secondary | ICD-10-CM

## 2023-10-24 DIAGNOSIS — N6002 Solitary cyst of left breast: Secondary | ICD-10-CM | POA: Diagnosis not present

## 2024-02-21 ENCOUNTER — Other Ambulatory Visit: Payer: Self-pay | Admitting: Certified Nurse Midwife

## 2024-02-21 DIAGNOSIS — R0981 Nasal congestion: Secondary | ICD-10-CM

## 2024-03-19 DIAGNOSIS — Z3041 Encounter for surveillance of contraceptive pills: Secondary | ICD-10-CM | POA: Diagnosis not present

## 2024-03-19 DIAGNOSIS — Z124 Encounter for screening for malignant neoplasm of cervix: Secondary | ICD-10-CM | POA: Diagnosis not present

## 2024-03-19 DIAGNOSIS — Z113 Encounter for screening for infections with a predominantly sexual mode of transmission: Secondary | ICD-10-CM | POA: Diagnosis not present

## 2024-03-19 DIAGNOSIS — N898 Other specified noninflammatory disorders of vagina: Secondary | ICD-10-CM | POA: Diagnosis not present

## 2024-03-19 DIAGNOSIS — A6009 Herpesviral infection of other urogenital tract: Secondary | ICD-10-CM | POA: Diagnosis not present

## 2024-03-19 DIAGNOSIS — Z01419 Encounter for gynecological examination (general) (routine) without abnormal findings: Secondary | ICD-10-CM | POA: Diagnosis not present

## 2024-03-19 DIAGNOSIS — A6000 Herpesviral infection of urogenital system, unspecified: Secondary | ICD-10-CM | POA: Diagnosis not present

## 2024-04-22 DIAGNOSIS — J069 Acute upper respiratory infection, unspecified: Secondary | ICD-10-CM | POA: Diagnosis not present

## 2024-04-22 DIAGNOSIS — R070 Pain in throat: Secondary | ICD-10-CM | POA: Diagnosis not present

## 2024-04-23 DIAGNOSIS — N898 Other specified noninflammatory disorders of vagina: Secondary | ICD-10-CM | POA: Diagnosis not present

## 2024-05-28 DIAGNOSIS — Z0000 Encounter for general adult medical examination without abnormal findings: Secondary | ICD-10-CM | POA: Diagnosis not present

## 2024-05-28 DIAGNOSIS — Z9109 Other allergy status, other than to drugs and biological substances: Secondary | ICD-10-CM | POA: Diagnosis not present

## 2024-05-28 DIAGNOSIS — Z131 Encounter for screening for diabetes mellitus: Secondary | ICD-10-CM | POA: Diagnosis not present

## 2024-05-28 DIAGNOSIS — J4520 Mild intermittent asthma, uncomplicated: Secondary | ICD-10-CM | POA: Diagnosis not present
